# Patient Record
Sex: Male | Born: 1970 | Race: White | Hispanic: No | Marital: Married | Smoking: Former smoker
Health system: Southern US, Community
[De-identification: ages and names within clinical notes are randomized; demographics above are authoritative.]

## PROBLEM LIST (undated history)

## (undated) DIAGNOSIS — I10 Essential (primary) hypertension: Secondary | ICD-10-CM

## (undated) DIAGNOSIS — N289 Disorder of kidney and ureter, unspecified: Secondary | ICD-10-CM

## (undated) DIAGNOSIS — E119 Type 2 diabetes mellitus without complications: Secondary | ICD-10-CM

## (undated) HISTORY — PX: OTHER SURGICAL HISTORY: SHX169

---

## 2005-06-24 ENCOUNTER — Emergency Department: Payer: Self-pay

## 2005-08-20 ENCOUNTER — Emergency Department: Payer: Self-pay | Admitting: Emergency Medicine

## 2005-09-04 ENCOUNTER — Inpatient Hospital Stay: Payer: Self-pay | Admitting: Internal Medicine

## 2005-10-13 ENCOUNTER — Inpatient Hospital Stay (HOSPITAL_COMMUNITY): Admission: AD | Admit: 2005-10-13 | Discharge: 2005-10-16 | Payer: Self-pay | Admitting: Internal Medicine

## 2005-10-13 ENCOUNTER — Emergency Department: Payer: Self-pay | Admitting: Emergency Medicine

## 2011-07-28 ENCOUNTER — Emergency Department: Payer: Self-pay | Admitting: Emergency Medicine

## 2011-07-28 LAB — URINALYSIS, COMPLETE
Bacteria: NONE SEEN
Bilirubin,UR: NEGATIVE
Blood: NEGATIVE
Glucose,UR: >=500 mg/dL (ref 0–75)
Leukocyte Esterase: NEGATIVE
Nitrite: NEGATIVE
Ph: 5 (ref 4.5–8.0)
Protein: NEGATIVE
RBC,UR: NONE SEEN /HPF (ref 0–5)
Specific Gravity: 1.032 (ref 1.003–1.030)
Squamous Epithelial: NONE SEEN
WBC UR: NONE SEEN /HPF (ref 0–5)

## 2011-07-28 LAB — COMPREHENSIVE METABOLIC PANEL
Albumin: 4.3 g/dL (ref 3.4–5.0)
Alkaline Phosphatase: 101 U/L (ref 50–136)
Anion Gap: 11 (ref 7–16)
BUN: 21 mg/dL — ABNORMAL HIGH (ref 7–18)
Bilirubin,Total: 0.6 mg/dL (ref 0.2–1.0)
Calcium, Total: 9.5 mg/dL (ref 8.5–10.1)
Chloride: 95 mmol/L — ABNORMAL LOW (ref 98–107)
Co2: 26 mmol/L (ref 21–32)
Creatinine: 1.08 mg/dL (ref 0.60–1.30)
EGFR (African American): >60
EGFR (Non-African Amer.): >60
Glucose: 471 mg/dL — ABNORMAL HIGH (ref 65–99)
Osmolality: 288 (ref 275–301)
Potassium: 4 mmol/L (ref 3.5–5.1)
SGOT(AST): 13 U/L — ABNORMAL LOW (ref 15–37)
SGPT (ALT): 21 U/L
Sodium: 132 mmol/L — ABNORMAL LOW (ref 136–145)
Total Protein: 7.9 g/dL (ref 6.4–8.2)

## 2011-07-28 LAB — CBC
HCT: 47.1 % (ref 40.0–52.0)
HGB: 15.6 g/dL (ref 13.0–18.0)
MCH: 29.2 pg (ref 26.0–34.0)
MCHC: 33.2 g/dL (ref 32.0–36.0)
MCV: 88 fL (ref 80–100)
Platelet: 239 10*3/uL (ref 150–440)
RBC: 5.34 10*6/uL (ref 4.40–5.90)
RDW: 12.2 % (ref 11.5–14.5)
WBC: 12.5 10*3/uL — ABNORMAL HIGH (ref 3.8–10.6)

## 2012-11-15 ENCOUNTER — Emergency Department: Payer: Self-pay | Admitting: Emergency Medicine

## 2012-11-15 LAB — CBC
HCT: 42.7 % (ref 40.0–52.0)
HGB: 15.1 g/dL (ref 13.0–18.0)
MCH: 30 pg (ref 26.0–34.0)
MCHC: 35.3 g/dL (ref 32.0–36.0)
MCV: 85 fL (ref 80–100)
Platelet: 201 10*3/uL (ref 150–440)
RBC: 5.02 10*6/uL (ref 4.40–5.90)
RDW: 12.7 % (ref 11.5–14.5)
WBC: 15.6 10*3/uL — ABNORMAL HIGH (ref 3.8–10.6)

## 2012-11-15 LAB — COMPREHENSIVE METABOLIC PANEL
Albumin: 3.5 g/dL (ref 3.4–5.0)
Alkaline Phosphatase: 90 U/L (ref 50–136)
Anion Gap: 5 — ABNORMAL LOW (ref 7–16)
BUN: 16 mg/dL (ref 7–18)
Bilirubin,Total: 0.7 mg/dL (ref 0.2–1.0)
Calcium, Total: 8.8 mg/dL (ref 8.5–10.1)
Chloride: 106 mmol/L (ref 98–107)
Co2: 29 mmol/L (ref 21–32)
Creatinine: 0.8 mg/dL (ref 0.60–1.30)
EGFR (African American): >60
EGFR (Non-African Amer.): >60
Glucose: 177 mg/dL — ABNORMAL HIGH (ref 65–99)
Osmolality: 285 (ref 275–301)
Potassium: 3.1 mmol/L — ABNORMAL LOW (ref 3.5–5.1)
SGOT(AST): 23 U/L (ref 15–37)
SGPT (ALT): 20 U/L (ref 12–78)
Sodium: 140 mmol/L (ref 136–145)
Total Protein: 6.8 g/dL (ref 6.4–8.2)

## 2012-11-15 LAB — TROPONIN I: Troponin-I: <0.02 ng/mL

## 2012-11-15 LAB — LIPASE, BLOOD: Lipase: 58 U/L — ABNORMAL LOW (ref 73–393)

## 2013-08-04 ENCOUNTER — Encounter (HOSPITAL_COMMUNITY): Payer: Self-pay | Admitting: Emergency Medicine

## 2013-08-04 DIAGNOSIS — R55 Syncope and collapse: Secondary | ICD-10-CM | POA: Insufficient documentation

## 2013-08-04 DIAGNOSIS — E119 Type 2 diabetes mellitus without complications: Secondary | ICD-10-CM | POA: Insufficient documentation

## 2013-08-04 DIAGNOSIS — Z87891 Personal history of nicotine dependence: Secondary | ICD-10-CM | POA: Insufficient documentation

## 2013-08-04 DIAGNOSIS — Z87448 Personal history of other diseases of urinary system: Secondary | ICD-10-CM | POA: Insufficient documentation

## 2013-08-04 LAB — I-STAT CHEM 8, ED
BUN: 14 mg/dL (ref 6–23)
Calcium, Ion: 1.22 mmol/L (ref 1.12–1.23)
Chloride: 97 mEq/L (ref 96–112)
Creatinine, Ser: 0.8 mg/dL (ref 0.50–1.35)
Glucose, Bld: 419 mg/dL — ABNORMAL HIGH (ref 70–99)
HCT: 44 % (ref 39.0–52.0)
Hemoglobin: 15 g/dL (ref 13.0–17.0)
Potassium: 3.9 mEq/L (ref 3.7–5.3)
Sodium: 137 mEq/L (ref 137–147)
TCO2: 27 mmol/L (ref 0–100)

## 2013-08-04 LAB — CBC
HCT: 41.5 % (ref 39.0–52.0)
Hemoglobin: 15 g/dL (ref 13.0–17.0)
MCH: 30.2 pg (ref 26.0–34.0)
MCHC: 36.1 g/dL — ABNORMAL HIGH (ref 30.0–36.0)
MCV: 83.7 fL (ref 78.0–100.0)
Platelets: 187 10*3/uL (ref 150–400)
RBC: 4.96 MIL/uL (ref 4.22–5.81)
RDW: 12 % (ref 11.5–15.5)
WBC: 6.8 10*3/uL (ref 4.0–10.5)

## 2013-08-04 LAB — URINALYSIS, ROUTINE W REFLEX MICROSCOPIC
Bilirubin Urine: NEGATIVE
Glucose, UA: >1000 mg/dL — AB
Hgb urine dipstick: NEGATIVE
Ketones, ur: 15 mg/dL — AB
Leukocytes, UA: NEGATIVE
Nitrite: NEGATIVE
Protein, ur: NEGATIVE mg/dL
Specific Gravity, Urine: 1.01 (ref 1.005–1.030)
Urobilinogen, UA: 1 mg/dL (ref 0.0–1.0)
pH: 6 (ref 5.0–8.0)

## 2013-08-04 LAB — URINE MICROSCOPIC-ADD ON

## 2013-08-04 NOTE — ED Notes (Signed)
Pt in via EMS to triage c/o dehydration, pt called from [employer] due to hands shaking, CBG 310, states he has been working outside all day and hasn't eaten anything all day and wanted to get checked out. Pt ambulatory without distress.

## 2013-08-04 NOTE — ED Notes (Signed)
[facility] very sweaty, hot, dizzy; had no dinner today and is DM - cbg by ems 310.

## 2013-08-05 ENCOUNTER — Emergency Department (HOSPITAL_COMMUNITY)
Admission: EM | Admit: 2013-08-05 | Discharge: 2013-08-05 | Disposition: A | Discharge status: Home / Self Care | Payer: Self-pay | Attending: Emergency Medicine | Admitting: Emergency Medicine

## 2013-08-05 DIAGNOSIS — R739 Hyperglycemia, unspecified: Secondary | ICD-10-CM

## 2013-08-05 DIAGNOSIS — R55 Syncope and collapse: Secondary | ICD-10-CM

## 2013-08-05 HISTORY — DX: Disorder of kidney and ureter, unspecified: N28.9

## 2013-08-05 HISTORY — DX: Type 2 diabetes mellitus without complications: E11.9

## 2013-08-05 LAB — CBG MONITORING, ED: Glucose-Capillary: 441 mg/dL — ABNORMAL HIGH (ref 70–99)

## 2013-08-05 NOTE — ED Notes (Signed)
MD at bedside. 

## 2013-08-05 NOTE — ED Notes (Signed)
Pt reports he has not taken his insulin tonight, pt reports he was at [employer] earlier this evening and he got really sweaty and did not feel like himself, pt's wife states she thinks he had a panic attack. Pt states his CBG is usually between 200-400. Pt reports he was unsure if he should take his insulin while he was in the waiting room because he did not want to mess up his examination.

## 2013-08-05 NOTE — ED Provider Notes (Signed)
CSN: [id number]     [id number] date & time 08/04/13  1931 History   First MD Initiated Contact with Patient 08/05/13 0129     Chief Complaint  Patient presents with  . Dehydration     (Consider location/radiation/quality/duration/timing/severity/associated sxs/prior Treatment) HPI Comments: 43 year old male who is a diabetic, he was in a restaurant this evening, ordered and ice cream and as he was eating it he states that he felt sweaty, lightheaded and had near syncope. He noticed mild tremor, the symptoms completely resolved rather quickly and he is now asymptomatic. His gait was otherwise normal, no chest pain shortness of breath belly pain back pain diarrhea dysuria sore throat headache or other complaints. His blood sugar was 274 at the time of the incident, he has not had his nighttime insulin.  The history is provided by the patient and the spouse.    Past Medical History  Diagnosis Date  . Diabetes mellitus without complication   . Kidney disease    History reviewed. No pertinent past surgical history. History reviewed. No pertinent family history. History  Substance Use Topics  . Smoking status: Former Games developermoker  . Smokeless tobacco: Not on file  . Alcohol Use: No    Review of Systems  All other systems reviewed and are negative.     Allergies  Review of patient's allergies indicates no known allergies.  Home Medications   Prior to Admission medications   Not on File   BP 117/75  Pulse 59  Temp(Src) 97.9 F (36.6 C) (Oral)  Resp 16  SpO2 98% Physical Exam  Nursing note and vitals reviewed. Constitutional: He appears well-developed and well-nourished. No distress.  HENT:  Head: Normocephalic and atraumatic.  Mouth/Throat: Oropharynx is clear and moist. No oropharyngeal exudate.  Eyes: Conjunctivae and EOM are normal. Pupils are equal, round, and reactive to light. Right eye exhibits no discharge. Left eye exhibits no discharge. No scleral icterus.  Neck:  Normal range of motion. Neck supple. No JVD present. No thyromegaly present.  Cardiovascular: Normal rate, regular rhythm, normal heart sounds and intact distal pulses.  Exam reveals no gallop and no friction rub.   No murmur heard. Pulmonary/Chest: Effort normal and breath sounds normal. No respiratory distress. He has no wheezes. He has no rales.  Abdominal: Soft. Bowel sounds are normal. He exhibits no distension and no mass. There is no tenderness.  Musculoskeletal: Normal range of motion. He exhibits no edema and no tenderness.  Lymphadenopathy:    He has no cervical adenopathy.  Neurological: He is alert. Coordination normal.  Skin: Skin is warm and dry. No rash noted. No erythema.  Psychiatric: He has a normal mood and affect. His behavior is normal.    ED Course  Procedures (including critical care time) Labs Review Labs Reviewed  CBC - Abnormal; Notable for the following:    MCHC 36.1 (*)    All other components within normal limits  URINALYSIS, ROUTINE W REFLEX MICROSCOPIC - Abnormal; Notable for the following:    Glucose, UA >1000 (*)    Ketones, ur 15 (*)    All other components within normal limits  I-STAT CHEM 8, ED - Abnormal; Notable for the following:    Glucose, Bld 419 (*)    All other components within normal limits  CBG MONITORING, ED - Abnormal; Notable for the following:    Glucose-Capillary 441 (*)    All other components within normal limits  URINE MICROSCOPIC-ADD ON    Imaging Review No results found.  EKG Interpretation   Date/Time:  Friday Aug 05 2013 01:59:22 EDT Ventricular Rate:  57 PR Interval:  193 QRS Duration: 87 QT Interval:  452 QTC Calculation: 440 R Axis:   89 Text Interpretation:  Sinus bradycardia ECG OTHERWISE WITHIN NORMAL LIMITS  Since last tracing rate slower Confirmed by Sorah Falkenstein  MD, Shamyra Farias ([id number]) on  08/05/2013 2:04:25 AM      MDM   Final diagnoses:  Near syncope  Hyperglycemia    The patient has a normal exam  however his urinalysis shows glucosuria, mild ketonuria and he is hyperglycemic on laboratory workup. There is no acidosis, no increased anion gap at the patient otherwise appears stable. He has no symptoms, his EKG is normal, he is stable for discharge. The patient has been informed of his exam, his results and his indications and need for followup and has expressed his understanding.  The patient will be discharged home in stable condition, he has taken his insulin in the emergency department prior to discharge    Vida RollerBrian D Jamelyn Bovard, MD 08/05/13 0206

## 2013-08-05 NOTE — ED Notes (Signed)
Pt's family requested ice. Ice provided.

## 2013-08-09 LAB — CBG MONITORING, ED: Glucose-Capillary: 351 mg/dL — ABNORMAL HIGH (ref 70–99)

## 2013-11-05 ENCOUNTER — Inpatient Hospital Stay: Payer: Self-pay | Admitting: Internal Medicine

## 2013-11-05 LAB — CBC WITH DIFFERENTIAL/PLATELET
Basophil #: 0.1 10*3/uL (ref 0.0–0.1)
Basophil %: 0.3 %
Eosinophil #: 0 10*3/uL (ref 0.0–0.7)
Eosinophil %: 0 %
HCT: 47.7 % (ref 40.0–52.0)
HGB: 16 g/dL (ref 13.0–18.0)
Lymphocyte #: 0.9 10*3/uL — ABNORMAL LOW (ref 1.0–3.6)
Lymphocyte %: 3.5 %
MCH: 29.9 pg (ref 26.0–34.0)
MCHC: 33.5 g/dL (ref 32.0–36.0)
MCV: 90 fL (ref 80–100)
Monocyte #: 1.1 x10 3/mm — ABNORMAL HIGH (ref 0.2–1.0)
Monocyte %: 4.3 %
Neutrophil #: 23.9 10*3/uL — ABNORMAL HIGH (ref 1.4–6.5)
Neutrophil %: 91.9 %
Platelet: 270 10*3/uL (ref 150–440)
RBC: 5.33 10*6/uL (ref 4.40–5.90)
RDW: 12.4 % (ref 11.5–14.5)
WBC: 26 10*3/uL — ABNORMAL HIGH (ref 3.8–10.6)

## 2013-11-05 LAB — URINALYSIS, COMPLETE
Bacteria: NONE SEEN
Bilirubin,UR: NEGATIVE
Blood: NEGATIVE
Glucose,UR: >=500 mg/dL (ref 0–75)
Leukocyte Esterase: NEGATIVE
Nitrite: NEGATIVE
Ph: 5 (ref 4.5–8.0)
Protein: NEGATIVE
RBC,UR: NONE SEEN /HPF (ref 0–5)
Specific Gravity: 1.029 (ref 1.003–1.030)
Squamous Epithelial: NONE SEEN
WBC UR: <1 /HPF (ref 0–5)

## 2013-11-05 LAB — COMPREHENSIVE METABOLIC PANEL
Albumin: 4.5 g/dL (ref 3.4–5.0)
Alkaline Phosphatase: 88 U/L
Anion Gap: 12 (ref 7–16)
BUN: 29 mg/dL — ABNORMAL HIGH (ref 7–18)
Bilirubin,Total: 1.3 mg/dL — ABNORMAL HIGH (ref 0.2–1.0)
Calcium, Total: 9.9 mg/dL (ref 8.5–10.1)
Chloride: 99 mmol/L (ref 98–107)
Co2: 23 mmol/L (ref 21–32)
Creatinine: 1.29 mg/dL (ref 0.60–1.30)
EGFR (African American): >60
EGFR (Non-African Amer.): >60
Glucose: 478 mg/dL — ABNORMAL HIGH (ref 65–99)
Osmolality: 295 (ref 275–301)
Potassium: 4.5 mmol/L (ref 3.5–5.1)
SGOT(AST): 21 U/L (ref 15–37)
SGPT (ALT): 21 U/L
Sodium: 134 mmol/L — ABNORMAL LOW (ref 136–145)
Total Protein: 8.2 g/dL (ref 6.4–8.2)

## 2013-11-05 LAB — TROPONIN I: Troponin-I: <0.02 ng/mL

## 2013-11-05 LAB — LIPASE, BLOOD: Lipase: 46 U/L — ABNORMAL LOW (ref 73–393)

## 2013-11-05 LAB — SEDIMENTATION RATE: Erythrocyte Sed Rate: 3 mm/hr (ref 0–15)

## 2013-11-05 LAB — MAGNESIUM: Magnesium: 2.1 mg/dL

## 2013-11-06 LAB — CBC WITH DIFFERENTIAL/PLATELET
Basophil #: 0.1 10*3/uL (ref 0.0–0.1)
Basophil %: 0.6 %
Eosinophil #: 0.2 10*3/uL (ref 0.0–0.7)
Eosinophil %: 1.3 %
HCT: 38.8 % — ABNORMAL LOW (ref 40.0–52.0)
HGB: 13 g/dL (ref 13.0–18.0)
Lymphocyte #: 2.8 10*3/uL (ref 1.0–3.6)
Lymphocyte %: 21.3 %
MCH: 29.6 pg (ref 26.0–34.0)
MCHC: 33.4 g/dL (ref 32.0–36.0)
MCV: 89 fL (ref 80–100)
Monocyte #: 0.8 x10 3/mm (ref 0.2–1.0)
Monocyte %: 5.8 %
Neutrophil #: 9.3 10*3/uL — ABNORMAL HIGH (ref 1.4–6.5)
Neutrophil %: 71 %
Platelet: 190 10*3/uL (ref 150–440)
RBC: 4.38 10*6/uL — ABNORMAL LOW (ref 4.40–5.90)
RDW: 12.1 % (ref 11.5–14.5)
WBC: 13.1 10*3/uL — ABNORMAL HIGH (ref 3.8–10.6)

## 2013-11-06 LAB — BASIC METABOLIC PANEL
Anion Gap: 4 — ABNORMAL LOW (ref 7–16)
BUN: 22 mg/dL — ABNORMAL HIGH (ref 7–18)
Calcium, Total: 8.2 mg/dL — ABNORMAL LOW (ref 8.5–10.1)
Chloride: 109 mmol/L — ABNORMAL HIGH (ref 98–107)
Co2: 27 mmol/L (ref 21–32)
Creatinine: 0.9 mg/dL (ref 0.60–1.30)
EGFR (African American): >60
EGFR (Non-African Amer.): >60
Glucose: 139 mg/dL — ABNORMAL HIGH (ref 65–99)
Osmolality: 285 (ref 275–301)
Potassium: 4.9 mmol/L (ref 3.5–5.1)
Sodium: 140 mmol/L (ref 136–145)

## 2013-11-06 LAB — HEMOGLOBIN A1C: Hemoglobin A1C: 10.3 % — ABNORMAL HIGH (ref 4.2–6.3)

## 2013-11-10 LAB — CULTURE, BLOOD (SINGLE)

## 2014-07-29 NOTE — Discharge Summary (Signed)
PATIENT NAMLillia Francis:  Francis Francis Francis MR#:  [id number] DATE OF BIRTH:  Jun 07, 1970  DATE OF ADMISSION:  11/05/2013 DATE OF DISCHARGE:  11/06/2013  PRIMARY CARE PHYSICIAN: Nonlocal.  DISCHARGE DIAGNOSES:  1.  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  2.  Dehydration.  3.  Acute gastritis.   CONDITION: Stable.   CODE STATUS: Full code.   HOME MEDICATIONS: Please refer to the medication reconciliation list. Continue the patient's home medications.  DIET: ADA diet.   ACTIVITY: As tolerated.   FOLLOWUP CARE: Follow with PCP within 1-2 weeks.   REASON FOR ADMISSION: Multiple episodes of nausea and vomiting. The patient is a 44 year old Caucasian male with a history of diabetes and hypertension who presented to the ED with multiple episodes of nausea and vomiting, but no diarrhea. The patient was noted to have leukocytosis at 26,000. Blood sugar was 478.    For detailed history and physical examination, please refer to the admission note dictated by Dr. Joycelyn RuaMichael Francis.   LABORATORY DATA ON ADMISSION: Showed BUN 29, creatinine 1.29, glucose 478, sodium 134, WBCs 26,000; otherwise unremarkable.   BRIEF HOSPITAL COURSE:  1.  Hyperosmotic non-ketotic state with type 2 diabetes. After admission, the patient has been treated with IV fluid support with Levemir and sliding scale NovoLog 12 units subcutaneously t.i.d. before meals. The patient's blood sugar has been improving. The last blood sugar was 192.  2.  Dehydration. After IV fluid support BUN decreased to 22.  3.  Acute gastritis with reactive leukocytosis. The patient's nausea and vomiting have improved after admission. WBC decreased to 13.1.   The patient has no complaints. His vital signs are stable. He is clinically stable and will be discharged to home today.   I discussed the patient's discharge plan with the patient, the patient's wife, and his nurse.   TIME SPENT: About 36 minutes.    ____________________________ Antonio PollackQing  Chieko Neises, MD qc:lt D: 11/06/2013 11:30:56 ET T: 11/06/2013 12:40:05 ET JOB#: [id number]  cc: Antonio PollackQing Antonio Herne, MD, <Dictator> Antonio PollackQING Jann Milkovich MD ELECTRONICALLY SIGNED 11/07/2013 11:46

## 2014-07-29 NOTE — H&P (Signed)
PATIENT NAMEDILAN, [name] MR#:  [id number] DATE OF BIRTH:  1970/12/13  DATE OF ADMISSION:  11/05/2013  REFERRING PHYSICIAN: Dorothea Glassman, MD  PRIMARY CARE PHYSICIAN: Nonlocal.  ADMIT DIAGNOSES:  1. Hyperosmotic nonketotic state in a patient with diabetes type 2. 2. Sepsis.  HISTORY OF PRESENT ILLNESS: This is a 44 year old Caucasian male who presented to the Emergency Department with multiple episodes of nausea and vomiting. The patient states he never saw any blood in his vomit but that it was consistently green in color. He denies any significant abdominal pain aside from when he is retching. He states that he has diabetes, and he admits to chills. All of his symptoms began today, approximately 6 hours prior to arrival in the Emergency Department. Prior to that, he was in his usual state of health. Laboratory evaluation revealed leukocytosis of 26,000 as well as a blood glucose of 478, which prompted the Emergency Department staff to contact hospitalist service for admission.   REVIEW OF SYSTEMS: CONSTITUTIONAL: The patient denies actual fever, but admits to chills and feeling of generalized fatigue.  EYES: Denies decrease in visual acuity or inflammation of sclerae.  ENT: The patient denies nosebleeds or difficulty swallowing or pain with swallowing. RESPIRATORY: The patient denies cough or wheezing and shortness of breath.  CARDIOVASCULAR: The patient denies chest pain or palpitations.  GASTROINTESTINAL: The patient admits to nausea and vomiting. Denies abdominal pain or diarrhea.  GENITOURINARY: The patient denies dysuria or hematuria. ENDOCRINOLOGY: The patient denies temperature intolerance, but admits to polyuria.  HEMATOLOGIC AND LYMPHATIC: The patient denies easy bruising or bleeding.  INTEGUMENTARY: The patient denies rash, but admits to multiple inspect bites on both his arms and legs.  MUSCULOSKELETAL: The patient denies arthralgias or myalgias.  NEUROLOGIC: The patient denies  weakness or paresthesias. PSYCHIATRIC: The patient denies suicidal ideation or homicidal ideation.   PAST MEDICAL HISTORY: Significant for diabetes type 2 as well as hypertension.  PAST SURGICAL HISTORY: None.   FAMILY HISTORY: Diabetes type 2 in his sister as well as cervical cancer and diabetes in his mother.  SOCIAL HISTORY: The patient does not smoke, drink or do any drugs.   MEDICATIONS: Include: 1. Lisinopril 2.5 mg 1 tab p.o. daily. 2. Lantus 34 units subcutaneous at bedtime. 3. NovoLog 12 units 3 times daily before meals. 4. Pantoprazole 20 mg 1 tab p.o. daily.  ALLERGIES: No known drug allergies.   PERTINENT LABORATORY DATA AND RADIOGRAPHIC FINDINGS: White blood cell count of 26,000. Normal anion gap of 12. Sodium 134. KUB shows no acute cardiopulmonary process. There is a mild amount of stool in the large bowel. CT abdomen is pending at the time of dictation. Venous blood gas results: The pH 7.3, pCO2 42, bicarb 20.7, bicarb excess -5.5; on room air.   PHYSICAL EXAMINATION: VITAL SIGNS: Temperature 97.8, heart rate 98, respiratory rate 18, blood pressure 124/69, pulse oximetry 100% on room air. GENERAL: The patient is alert and oriented x3. He is in no apparent distress, although he can be seen with some occasional rigors.  HEENT: Normocephalic, atraumatic. EOMI. PERRLA. Mild tacky mucous membranes. Poor dentition. NECK: Trachea is midline. No adenopathy. CHEST: Symmetric and atraumatic. CARDIOVASCULAR: Regular rate and rhythm. Normal S1, S2. No rubs, clicks or murmurs. LUNGS: Clear to auscultation bilaterally. Normal effort and excursion. ABDOMEN: Positive bowel sounds. Soft, nontender, nondistended. No hepatosplenomegaly.  EXTREMITIES: No clubbing, cyanosis or edema. SKIN: No rashes, but there are multiple scratch and pick lesions consistent with excoriated insect wounds. He has no  fluctuance to any of these wounds, but some have a slightly greenish crust.   MUSCULOSKELETAL: The patient moves all 4 extremities equally. GENITOURINARY: Deferred. NEUROLOGIC: Cranial nerves II through XII grossly intact. PSYCHIATRIC: Mood is normal, affect is congruent.  ASSESSMENT AND PLAN:  1. This is a 44 year old with hyperosmolar nonketotic state. The patient has no anion gap, and he is nonketotic. We will start the patient on aggressive IV hydration. He will eat a carbohydrate consistent diet, and we will dose insulin based on sliding scale.  2. Sepsis. No definite etiology at this time; however, it is likely that many of the patient's insect bites may be infected and may have contributed to systemic infection. Urine cultures have been obtained. Blood cultures were actually obtained after antibiotics began. Zosyn started in the ED. I have added vancomycin to his treatment.  3. Nausea and vomiting. The bilious nature of the vomiting is somewhat concerning; however, the patient has a benign abdominal exam. Zofran p.r.n. nausea and vomiting, which I expect to resolve as his hyperglycemia improves and he has antibiotics in his system.  4. Gastrointestinal prophylaxis. Pantoprazole. 8. Deep venous thrombosis prophylaxis. SCDs.  CODE STATUS: The patient is a full code.   TIME SPENT ON ADMISSION ORDERS AND PATIENT CARE: 35 MINUTES  ____________________________ Kelton PillarMichael S. Sheryle Hailiamond, MD msd:lb D: 11/05/2013 07:37:35 ET T: 11/05/2013 07:52:19 ET JOB#: [id number]  cc: Kelton PillarMichael S. Sheryle Hailiamond, MD, <Dictator> Kelton PillarMICHAEL S Trevaughn Schear MD ELECTRONICALLY SIGNED 11/06/2013 1:37

## 2016-12-19 DIAGNOSIS — E860 Dehydration: Secondary | ICD-10-CM

## 2016-12-19 DIAGNOSIS — E1165 Type 2 diabetes mellitus with hyperglycemia: Secondary | ICD-10-CM

## 2016-12-19 DIAGNOSIS — E871 Hypo-osmolality and hyponatremia: Secondary | ICD-10-CM

## 2016-12-19 DIAGNOSIS — E1310 Other specified diabetes mellitus with ketoacidosis without coma: Secondary | ICD-10-CM

## 2016-12-19 DIAGNOSIS — R112 Nausea with vomiting, unspecified: Secondary | ICD-10-CM

## 2019-05-09 DIAGNOSIS — L03116 Cellulitis of left lower limb: Secondary | ICD-10-CM

## 2019-05-09 DIAGNOSIS — E11628 Type 2 diabetes mellitus with other skin complications: Secondary | ICD-10-CM

## 2019-05-09 DIAGNOSIS — E860 Dehydration: Secondary | ICD-10-CM

## 2019-05-09 DIAGNOSIS — S91332A Puncture wound without foreign body, left foot, initial encounter: Secondary | ICD-10-CM

## 2019-05-09 DIAGNOSIS — E871 Hypo-osmolality and hyponatremia: Secondary | ICD-10-CM

## 2019-05-11 DIAGNOSIS — L03116 Cellulitis of left lower limb: Secondary | ICD-10-CM

## 2019-05-11 DIAGNOSIS — S92332A Displaced fracture of third metatarsal bone, left foot, initial encounter for closed fracture: Secondary | ICD-10-CM

## 2019-05-11 DIAGNOSIS — E11628 Type 2 diabetes mellitus with other skin complications: Secondary | ICD-10-CM

## 2019-05-12 DIAGNOSIS — L03116 Cellulitis of left lower limb: Secondary | ICD-10-CM

## 2019-05-12 DIAGNOSIS — E11628 Type 2 diabetes mellitus with other skin complications: Secondary | ICD-10-CM

## 2019-05-12 DIAGNOSIS — S91332A Puncture wound without foreign body, left foot, initial encounter: Secondary | ICD-10-CM

## 2019-06-21 ENCOUNTER — Ambulatory Visit (INDEPENDENT_AMBULATORY_CARE_PROVIDER_SITE_OTHER): Payer: Medicaid Other | Admitting: Internal Medicine

## 2019-06-21 ENCOUNTER — Telehealth: Payer: Self-pay | Admitting: Internal Medicine

## 2019-06-21 ENCOUNTER — Other Ambulatory Visit: Payer: Self-pay

## 2019-06-21 VITALS — BP 132/80 | HR 84 | Temp 98.1°F | Wt 170.4 lb

## 2019-06-21 DIAGNOSIS — E785 Hyperlipidemia, unspecified: Secondary | ICD-10-CM

## 2019-06-21 DIAGNOSIS — R739 Hyperglycemia, unspecified: Secondary | ICD-10-CM

## 2019-06-21 DIAGNOSIS — Z794 Long term (current) use of insulin: Secondary | ICD-10-CM

## 2019-06-21 DIAGNOSIS — E119 Type 2 diabetes mellitus without complications: Secondary | ICD-10-CM

## 2019-06-21 LAB — POCT GLYCOSYLATED HEMOGLOBIN (HGB A1C): Hemoglobin A1C: 9.2 % — AB (ref 4.0–5.6)

## 2019-06-21 NOTE — Patient Instructions (Addendum)
-   Decrease Levemir 26 units daily  - Novolog 8 units with each meal  - Novolog correctional insulin: ADD extra units on insulin to your meal-time Novolog dose if your blood sugars are higher than 160. Use the scale below to help guide you:   Blood sugar before meal Number of units to inject  Less than 160 0 unit   161-  190 1 units  191 -  220 2 units  221 -  250 3 units  251 -  280 4 units  281 -  310 5 units  311-  340 6 units  341-  370 7 units  371 -  400 8 units  401-430 9 units    Choose healthy, lower carb lower calorie snacks: toss salad, cooked vegetables, cottage cheese, peanut butter, low fat cheese / string cheese, lower sodium deli meat, tuna salad or chicken salad     HOW TO TREAT LOW BLOOD SUGARS (Blood sugar LESS THAN 70 MG/DL)  Please follow the RULE OF 15 for the treatment of hypoglycemia treatment (when your (blood sugars are less than 70 mg/dL)    STEP 1: Take 15 grams of carbohydrates when your blood sugar is low, which includes:   3-4 GLUCOSE TABS  OR  3-4 OZ OF JUICE OR REGULAR SODA OR  ONE TUBE OF GLUCOSE GEL     STEP 2: RECHECK blood sugar in 15 MINUTES STEP 3: If your blood sugar is still low at the 15 minute recheck --> then, go back to STEP 1 and treat AGAIN with another 15 grams of carbohydrates.

## 2019-06-21 NOTE — Telephone Encounter (Signed)
LMTCB to discuss pt concern

## 2019-06-21 NOTE — Progress Notes (Signed)
Name: [name]  MRN/ DOB: [id number], 11-22-1970   Age/ Sex: 49 y.o., male    PCP: [place], Sarah T, MD   Reason for Endocrinology Evaluation: Type 2 Diabetes Mellitus     Date of Initial Endocrinology Visit: 06/21/2019     PATIENT IDENTIFIER: [name] is a 49 y.o. male with a past medical history of DM. The patient presented for initial endocrinology clinic visit on 06/21/2019 for consultative assistance with his diabetes management.    HPI: [name] is accompanied by his wife [name]    Diagnosed with DM at age 38  Prior Medications tried/Intolerance: he was initially on Glimepiride and metformin with persistent hyperglycemia, he was subsequently switched to insulin years later.  Currently checking blood sugars 3 x / day Hypoglycemia episodes : yes           Symptoms: feels funny, shaky              Frequency: /  Hemoglobin A1c has ranged from 8.6%  in 2020, peaking at 10.3% in 2.05 Patient required assistance for hypoglycemia: no  Patient has required hospitalization within the last 1 year from hyper or hypoglycemia: yes  In terms of diet, the patient eats 3 meals, snacks 3 x a day, drinks propel .   Pt is interested in a pump at some point   HOME DIABETES REGIMEN: Levemir 32 units daily  Novolog SS   70-130 = 2 units 131-180 = 4 units 181- 240 = 8 241- 300= 10 301- 350= 12  351- 400= 16    Statin:No ACE-I/ARB:No Prior Diabetic Education: Yes   METER DOWNLOAD SUMMARY: Date range evaluated:   68-475 mg/dL     DIABETIC COMPLICATIONS: Microvascular complications:    Denies: neuropathy, CKD , retinopathy   Last eye exam: Completed does not recall  Macrovascular complications:    Denies: CAD, PVD, CVA   PAST HISTORY: Past Medical History:  Past Medical History:  Diagnosis Date  . Diabetes mellitus without complication   . Kidney disease     Past Surgical History: No past surgical history on file.   Social History:  reports that he  has quit smoking. He does not have any smokeless tobacco history on file. He reports that he does not drink alcohol or use drugs.  Family History: No family history on file.   HOME MEDICATIONS: Allergies as of 06/21/2019      Reactions   Piperacillin Hives   Tazobactam Hives      Medication List       Accurate as of June 21, 2019  3:02 PM. If you have any questions, ask your nurse or doctor.        insulin aspart 100 UNIT/ML injection Commonly known as: novoLOG Inject 12 Units into the skin. 2 times daily   insulin detemir 100 UNIT/ML FlexPen Commonly known as: LEVEMIR Inject into the skin. 32 units nightly   oxyCODONE 5 MG immediate release tablet Commonly known as: Oxy IR/ROXICODONE Take 5 mg by mouth.        ALLERGIES: Allergies  Allergen Reactions  . Piperacillin Hives  . Tazobactam Hives     REVIEW OF SYSTEMS: A comprehensive ROS was conducted with the patient and is negative except as per HPI and below:  ROS    OBJECTIVE:   VITAL SIGNS: BP 132/80 (BP Location: Left Arm, Patient Position: Sitting, Cuff Size: Large)   Pulse 84   Temp 98.1 F (36.7 C)   Wt 170 lb  6.4 oz (77.3 kg)   SpO2 98%    PHYSICAL EXAM:  General: Pt appears well and is in NAD  HEENT:  Eyes: External eye exam normal without stare, lid lag or exophthalmos.  EOM intact  Neck: General: Supple without adenopathy or carotid bruits. Thyroid: Thyroid size normal.  No goiter or nodules appreciated. No thyroid bruit.  Lungs: Clear with good BS bilat with no rales, rhonchi, or wheezes  Heart: RRR with normal S1 and S2 and no gallops; no murmurs; no rub  Abdomen: Normoactive bowel sounds, soft, nontender, without masses or organomegaly palpable  Extremities:  Lower extremities - No pretibial edema. No lesions.  Skin: Normal texture and temperature to palpation.   Neuro: MS is good with appropriate affect, pt is alert and Ox3    DM foot exam: deferred   DATA  REVIEWED: 02/2019 A1c 8.9%     03/16/2019  Gluc 331 BUN/CR 17/0.84 GFR 102 TG 107 LDL 91  ASSESSMENT / PLAN / RECOMMENDATIONS:   1) Insulin- Dependent Diabetes Mellitus, Poorly controlled, Without complications - Most recent A1c of 9.2 %. Goal A1c < 7.0 %.    Plan: GENERAL: I have discussed with the patient the pathophysiology of diabetes. We went over the natural progression of the disease. We talked about both insulin resistance and insulin deficiency. We stressed the importance of lifestyle changes including diet and exercise. I explained the complications associated with diabetes including retinopathy, nephropathy, neuropathy as well as increased risk of cardiovascular disease. We went over the benefit seen with glycemic control.    I explained to the patient that diabetic patients are at higher than normal risk for amputations. Discussed pharmacokinetics of basal/bolus insulin and the importance of taking prandial insulin with meals.   We also discussed avoiding sugar-sweetened beverages and snacks, when possible.   We briefly discussed CGM and pumps, he will reach out to his insurance carrier to inquire about coverage criteria.    MEDICATIONS: - Decrease Levemir 26 units daily  - Novolog 8 units with each meal  - CF ( BG-130/30)  EDUCATION / INSTRUCTIONS:  BG monitoring instructions: Patient is instructed to check his blood sugars 4 times a day, before meals and bedtime.  Call [facility] if: BG persistently < 70 or > 300. . I reviewed the Rule of 15 for the treatment of hypoglycemia in detail with the patient. Literature supplied.   2) Diabetic complications:   Eye: Does not have known diabetic retinopathy. I have urged him to have an eye exam   Neuro/ Feet: Does not have known diabetic peripheral neuropathy.  Renal: Patient does not have known baseline CKD. He is not on an ACEI/ARB at present.Will check urine albumin/creatinine ratio today  .   3) Lipids: Patient is not on a statin, we discussed cardiovascular benefits of statin , we also discussed ADA recommendations, pt in agreement to start statins at this time but will check labs today.    F/U in 3 months    Addendum: pt left without labs, will postpone to next visit, if unable to come back sooner for this   Signed electronically by: Mack Guise, MD  [facility] End[facility]ology  [address] [place], [place] [address] Phone: [phone] FAX: [phone]   CC: [place], Sarah T, [place] [address] Phone: [phone]  Fax: [phone]    Return to Endocrinology clinic as below: Future Appointments  Date Time Provider [place]  07/13/2019  8:30 AM [facility] None  09/09/2019  8:30 AM [place] Mielke, Konrad Dolores, MD [facility] None

## 2019-06-22 ENCOUNTER — Encounter: Payer: Self-pay | Admitting: Internal Medicine

## 2019-06-27 NOTE — Telephone Encounter (Signed)
Still unable to connect with pt

## 2019-07-13 ENCOUNTER — Other Ambulatory Visit: Payer: Medicaid Other

## 2019-08-02 ENCOUNTER — Other Ambulatory Visit: Payer: Medicaid Other

## 2019-09-09 ENCOUNTER — Ambulatory Visit: Payer: Medicaid Other | Admitting: Internal Medicine

## 2019-10-19 DIAGNOSIS — L03116 Cellulitis of left lower limb: Secondary | ICD-10-CM | POA: Insufficient documentation

## 2019-10-19 DIAGNOSIS — E11621 Type 2 diabetes mellitus with foot ulcer: Secondary | ICD-10-CM | POA: Insufficient documentation

## 2019-10-19 DIAGNOSIS — L97509 Non-pressure chronic ulcer of other part of unspecified foot with unspecified severity: Secondary | ICD-10-CM | POA: Insufficient documentation

## 2019-10-19 DIAGNOSIS — E1110 Type 2 diabetes mellitus with ketoacidosis without coma: Secondary | ICD-10-CM | POA: Insufficient documentation

## 2019-10-19 DIAGNOSIS — E1165 Type 2 diabetes mellitus with hyperglycemia: Secondary | ICD-10-CM | POA: Insufficient documentation

## 2019-10-19 DIAGNOSIS — IMO0002 Reserved for concepts with insufficient information to code with codable children: Secondary | ICD-10-CM | POA: Insufficient documentation

## 2019-10-19 DIAGNOSIS — S91332A Puncture wound without foreign body, left foot, initial encounter: Secondary | ICD-10-CM | POA: Insufficient documentation

## 2019-10-19 DIAGNOSIS — R112 Nausea with vomiting, unspecified: Secondary | ICD-10-CM | POA: Insufficient documentation

## 2019-10-19 DIAGNOSIS — E860 Dehydration: Secondary | ICD-10-CM | POA: Insufficient documentation

## 2019-10-19 DIAGNOSIS — E871 Hypo-osmolality and hyponatremia: Secondary | ICD-10-CM | POA: Insufficient documentation

## 2019-10-20 ENCOUNTER — Other Ambulatory Visit: Payer: Self-pay

## 2019-10-20 ENCOUNTER — Ambulatory Visit (INDEPENDENT_AMBULATORY_CARE_PROVIDER_SITE_OTHER): Payer: Self-pay | Admitting: Sports Medicine

## 2019-10-20 DIAGNOSIS — M79672 Pain in left foot: Secondary | ICD-10-CM

## 2019-10-20 DIAGNOSIS — L03116 Cellulitis of left lower limb: Secondary | ICD-10-CM

## 2019-10-20 DIAGNOSIS — E0810 Diabetes mellitus due to underlying condition with ketoacidosis without coma: Secondary | ICD-10-CM

## 2019-10-20 DIAGNOSIS — L97423 Non-pressure chronic ulcer of left heel and midfoot with necrosis of muscle: Secondary | ICD-10-CM

## 2019-10-20 MED ORDER — SULFAMETHOXAZOLE-TRIMETHOPRIM 800-160 MG PO TABS: 1.0000 | ORAL_TABLET | Freq: Two times a day (BID) | ORAL | 0 refills | Status: DC

## 2019-10-20 NOTE — Progress Notes (Signed)
Subjective: [name] is a 49 y.o. male patient seen in office for evaluation of ulceration of the left heel for over 6 months. Patient has a history of diabetes and a blood glucose level not recorded but ranges from 80 to 200. Patient is changing the dressing using betadine. Reports that he went to ER last week and they took xrays and gave him antibiotics. Denies nausea/fever/vomiting/chills/night sweats/shortness of breath/pain. Patient has no other pedal complaints at this time.  Reports wound started as a small puncture wound that didn't heal and reports that when he was younger he had a injury to the heel.  Review of Systems  All other systems reviewed and are negative.  Patient Active Problem List   Diagnosis Date Noted  . Cellulitis of left foot 10/19/2019  . DKA (diabetic ketoacidoses) (HCC) 10/19/2019  . Hyponatremia 10/19/2019  . 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 Nausea & vomiting 10/19/2019  . Puncture wound of left foot 10/19/2019  . Ulcer of foot due to diabetes (HCC) 10/19/2019  . Uncontrolled type 2 diabetes mellitus (HCC) 10/19/2019  . Dyslipidemia 06/21/2019  . Insulin-requiring or dependent type II diabetes mellitus (HCC) 06/21/2019   Current Outpatient Medications on File Prior to Visit  Medication Sig Dispense Refill  . calcium carbonate (OSCAL) 1500 (600 Ca) MG TABS tablet Take by mouth 2 (two) times daily with a meal.    . Magnesium 250 MG TABS Take by mouth.    . insulin aspart (NOVOLOG) 100 UNIT/ML injection Inject 12 Units into the skin. 2 times daily    . insulin detemir (LEVEMIR) 100 UNIT/ML FlexPen Inject into the skin. 32 units nightly     No current facility-administered medications on file prior to visit.   Allergies  Allergen Reactions  . Piperacillin Hives  . Tazobactam Hives    No results found for this or any previous visit (from the past 2160 hour(s)).  Objective: There were no vitals filed for this visit.  General: Patient is awake,  alert, oriented x 3 and in no acute distress.  Dermatology: Skin is warm and dry bilateral with a full thickness ulceration present  Left central heel, Ulceration measures 6x5x3cm.There is a macerated and keratotic border with a granular base with no fatty tissue/muscle, granulation over the bone is present. There is minimal malodor, clear active drainage, faint erythema, mild edema.   Vascular: Dorsalis Pedis pulse = 2/4 Bilateral,  Posterior Tibial pulse = 1/4 Bilateral,  Capillary Fill Time < 5 seconds  Neurologic: Protective sensation diminished bilateral.  Musculosketal: There is no pain with palpation to ulcerated area. No pain with compression to calves bilateral.   No results for input(s): GRAMSTAIN, LABORGA in the last 8760 hours.  Assessment and Plan:  Problem List Items Addressed This Visit      Endocrine   DKA (diabetic ketoacidoses) (HCC)     Musculoskeletal and Integument   Cellulitis of left foot    Other Visit Diagnoses    Heel ulcer, left, with necrosis of muscle (HCC)    -  Primary   Left foot pain         -Examined patient and discussed the progression of the wound and treatment alternatives. -Xrays reviewed and Cultures reviewed from [facility] revealing staph and strept  - Excisionally dedbrided ulceration at Left plantar heel to healthy bleeding borders removing nonviable tissue using a sterile chisel blade. Wound measures post debridement as above. Wound was debrided to the level of the dermis with viable wound base exposed to  promote healing. Hemostasis was achieved with manuel pressure. Patient tolerated procedure well without any discomfort or anesthesia necessary for this wound debridement.  -Applied  and dry sterile dressing and instructed patient to continue with daily dressings at home consisting of Betadine gauze packing and dry sterile dressing. -Refill Bactrim -Requested wound VAC.  Patient desires to have his wife to learn how to reapply it. - Advised  patient to go to the ER or return to office if the wound worsens or if constitutional symptoms are present. -Patient to return to office in 1 week for follow up care and evaluation or sooner if problems arise.  Asencion Islam, DPM

## 2019-10-24 ENCOUNTER — Telehealth: Payer: Self-pay | Admitting: *Deleted

## 2019-10-24 DIAGNOSIS — L03116 Cellulitis of left lower limb: Secondary | ICD-10-CM

## 2019-10-24 DIAGNOSIS — L97423 Non-pressure chronic ulcer of left heel and midfoot with necrosis of muscle: Secondary | ICD-10-CM

## 2019-10-24 DIAGNOSIS — M79672 Pain in left foot: Secondary | ICD-10-CM

## 2019-10-24 DIAGNOSIS — E0810 Diabetes mellitus due to underlying condition with ketoacidosis without coma: Secondary | ICD-10-CM

## 2019-10-24 NOTE — Telephone Encounter (Signed)
-----   Message from Asencion Islam, [place] sent at 10/20/2019 10:55 PM EDT ----- Regarding: Wound VAC Shanesia Contact her to order wound VAC for patient.  Patient wants to have his wife learn how to apply and help with the dressing changes; rep to educate

## 2019-10-24 NOTE — Telephone Encounter (Signed)
Faxed required form, clinicals and demographics to [facility] - S. Earlene Plater, and emailed [email or web address].

## 2019-11-03 ENCOUNTER — Encounter: Payer: Self-pay | Admitting: Sports Medicine

## 2019-11-03 ENCOUNTER — Other Ambulatory Visit: Payer: Self-pay

## 2019-11-03 ENCOUNTER — Ambulatory Visit (INDEPENDENT_AMBULATORY_CARE_PROVIDER_SITE_OTHER): Payer: Self-pay | Admitting: Sports Medicine

## 2019-11-03 DIAGNOSIS — L97423 Non-pressure chronic ulcer of left heel and midfoot with necrosis of muscle: Secondary | ICD-10-CM

## 2019-11-03 DIAGNOSIS — L03116 Cellulitis of left lower limb: Secondary | ICD-10-CM

## 2019-11-03 DIAGNOSIS — E0810 Diabetes mellitus due to underlying condition with ketoacidosis without coma: Secondary | ICD-10-CM

## 2019-11-03 DIAGNOSIS — M79672 Pain in left foot: Secondary | ICD-10-CM

## 2019-11-03 NOTE — Progress Notes (Signed)
Subjective: [name] is a 49 y.o. male patient seen in office for follow up evaluation of ulceration of the left heel. Reports that the heel wound is getting smaller. Using packing but ran out of betadine. Patient got wound vac and has wife with him who is here to learn how to do the vac. Denies nausea/fever/vomiting/chills/night sweats/shortness of breath/pain. Patient has no other pedal complaints at this time.   Patient Active Problem List   Diagnosis Date Noted  . Cellulitis of left foot 10/19/2019  . DKA (diabetic ketoacidoses) (HCC) 10/19/2019  . Hyponatremia 10/19/2019  . 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 Nausea & vomiting 10/19/2019  . Puncture wound of left foot 10/19/2019  . Ulcer of foot due to diabetes (HCC) 10/19/2019  . Uncontrolled type 2 diabetes mellitus (HCC) 10/19/2019  . Dyslipidemia 06/21/2019  . Insulin-requiring or dependent type II diabetes mellitus (HCC) 06/21/2019   Current Outpatient Medications on File Prior to Visit  Medication Sig Dispense Refill  . calcium carbonate (OSCAL) 1500 (600 Ca) MG TABS tablet Take by mouth 2 (two) times daily with a meal.    . insulin aspart (NOVOLOG) 100 UNIT/ML injection Inject 12 Units into the skin. 2 times daily    . insulin detemir (LEVEMIR) 100 UNIT/ML FlexPen Inject into the skin. 32 units nightly    . Magnesium 250 MG TABS Take by mouth.    . sulfamethoxazole-trimethoprim (BACTRIM DS) 800-160 MG tablet Take 1 tablet by mouth 2 (two) times daily. 28 tablet 0   No current facility-administered medications on file prior to visit.   Allergies  Allergen Reactions  . Piperacillin Hives  . Tazobactam Hives    No results found for this or any previous visit (from the past 2160 hour(s)).  Objective: There were no vitals filed for this visit.  General: Patient is awake, alert, oriented x 3 and in no acute distress.  Dermatology: Skin is warm and dry bilateral with a full thickness ulceration present  Left central  heel, Ulceration measures 5x4x1cm.There is a minimally macerated and keratotic border with a granular base with no fatty tissue/muscle, granulation over the bone is present and does not probe as deep. There is minimal malodor, clear active drainage, resolved erythema, mild edema.   Vascular: Dorsalis Pedis pulse = 2/4 Bilateral,  Posterior Tibial pulse = 1/4 Bilateral,  Capillary Fill Time < 5 seconds  Neurologic: Protective sensation diminished bilateral.  Musculosketal: There is no pain with palpation to ulcerated area. No pain with compression to calves bilateral.   No results for input(s): GRAMSTAIN, LABORGA in the last 8760 hours.  Assessment and Plan:  Problem List Items Addressed This Visit      Endocrine   DKA (diabetic ketoacidoses) (HCC)     Musculoskeletal and Integument   Cellulitis of left foot    Other Visit Diagnoses    Heel ulcer, left, with necrosis of muscle (HCC)    -  Primary   Left foot pain         -Examined patient and discussed the progression of the wound and treatment alternatives. - Excisionally dedbrided ulceration at Left plantar heel to healthy bleeding borders removing nonviable tissue using a sterile chisel blade. Wound measures post debridement as above. Wound was debrided to the level of the dermis with viable wound base exposed to promote healing. Hemostasis was achieved with manuel pressure. Patient tolerated procedure well without any discomfort or anesthesia necessary for this wound debridement.  -Applied wound VAC at 125 mmHg with seal intact  with rep present who educated wife on how to properly change - Advised patient to go to the ER or return to office if the wound worsens or if constitutional symptoms are present. -Patient to return to office in 1 week for follow up care and evaluation or sooner if problems arise.  Asencion Islam, DPM

## 2019-11-08 ENCOUNTER — Ambulatory Visit: Payer: Medicaid Other | Admitting: Sports Medicine

## 2019-11-15 ENCOUNTER — Ambulatory Visit: Payer: Medicaid Other | Admitting: Sports Medicine

## 2019-11-22 ENCOUNTER — Encounter: Payer: Self-pay | Admitting: Sports Medicine

## 2019-11-22 ENCOUNTER — Other Ambulatory Visit: Payer: Self-pay

## 2019-11-22 ENCOUNTER — Ambulatory Visit (INDEPENDENT_AMBULATORY_CARE_PROVIDER_SITE_OTHER): Payer: Self-pay | Admitting: Sports Medicine

## 2019-11-22 DIAGNOSIS — M79672 Pain in left foot: Secondary | ICD-10-CM

## 2019-11-22 DIAGNOSIS — L97423 Non-pressure chronic ulcer of left heel and midfoot with necrosis of muscle: Secondary | ICD-10-CM

## 2019-11-22 DIAGNOSIS — L03116 Cellulitis of left lower limb: Secondary | ICD-10-CM

## 2019-11-22 DIAGNOSIS — E0810 Diabetes mellitus due to underlying condition with ketoacidosis without coma: Secondary | ICD-10-CM

## 2019-11-22 NOTE — Addendum Note (Signed)
Addended by: Dinah Beers on: 11/22/2019 02:37 PM   Modules accepted: Orders

## 2019-11-22 NOTE — Progress Notes (Signed)
Subjective: [name] is a 49 y.o. male patient seen in office for follow up evaluation of ulceration of the left heel. Reports that the heel wound is doing better without the wound VAC reports that him and his wife cannot do the dressing changes too much to handle right now at this time so wants to return it.patient denies sweats/shortness of breath/pain. Patient has no other pedal complaints at this time.   Patient Active Problem List   Diagnosis Date Noted  . Cellulitis of left foot 10/19/2019  . DKA (diabetic ketoacidoses) (HCC) 10/19/2019  . Hyponatremia 10/19/2019  . 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 Nausea & vomiting 10/19/2019  . Puncture wound of left foot 10/19/2019  . Ulcer of foot due to diabetes (HCC) 10/19/2019  . Uncontrolled type 2 diabetes mellitus (HCC) 10/19/2019  . Dyslipidemia 06/21/2019  . Insulin-requiring or dependent type II diabetes mellitus (HCC) 06/21/2019   Current Outpatient Medications on File Prior to Visit  Medication Sig Dispense Refill  . calcium carbonate (OSCAL) 1500 (600 Ca) MG TABS tablet Take by mouth 2 (two) times daily with a meal.    . insulin aspart (NOVOLOG) 100 UNIT/ML injection Inject 12 Units into the skin. 2 times daily    . insulin detemir (LEVEMIR) 100 UNIT/ML FlexPen Inject into the skin. 32 units nightly    . Magnesium 250 MG TABS Take by mouth.    . sulfamethoxazole-trimethoprim (BACTRIM DS) 800-160 MG tablet Take 1 tablet by mouth 2 (two) times daily. 28 tablet 0   No current facility-administered medications on file prior to visit.   Allergies  Allergen Reactions  . Piperacillin Hives  . Tazobactam Hives    No results found for this or any previous visit (from the past 2160 hour(s)).  Objective: There were no vitals filed for this visit.  General: Patient is awake, alert, oriented x 3 and in no acute distress.  Dermatology: Skin is warm and dry bilateral with a full thickness ulceration present  Left central heel,  Ulceration measures 5x4.2x1cm.There is a increased macerated and keratotic border with a granular base with no fatty tissue/muscle, granulation over the bone is present and does not probe as deep. There is mild malodor, clear active drainage, resolved erythema, mild edema.   Vascular: Dorsalis Pedis pulse = 2/4 Bilateral,  Posterior Tibial pulse = 1/4 Bilateral,  Capillary Fill Time < 5 seconds  Neurologic: Protective sensation diminished bilateral.  Musculosketal: There is no pain with palpation to ulcerated area. No pain with compression to calves bilateral.   No results for input(s): GRAMSTAIN, LABORGA in the last 8760 hours.  Assessment and Plan:  Problem List Items Addressed This Visit      Endocrine   DKA (diabetic ketoacidoses) (HCC)     Musculoskeletal and Integument   Cellulitis of left foot    Other Visit Diagnoses    Heel ulcer, left, with necrosis of muscle (HCC)    -  Primary   Left foot pain         -Examined patient and discussed the progression of the wound and treatment alternatives. - Excisionally dedbrided ulceration at Left plantar heel to healthy bleeding borders removing nonviable tissue using a sterile chisel blade. Wound measures post debridement as above. Wound was debrided to the level of the dermis with viable wound base exposed to promote healing. Hemostasis was achieved with manuel pressure. Patient tolerated procedure well without any discomfort or anesthesia necessary for this wound debridement.  -Wound culture obtained due to maceration and  malodor will call patient if he needs to start an oral antibiotic -Applied Betadine to the periwound area and gauze packing plantarly and advised patient to change dressings daily consisting of the same with assistance from wife -[employer] rep contacted to arrange pickup/return of wound VAC - Advised patient to go to the ER or return to office if the wound worsens or if constitutional symptoms are present. -Patient to  return to office in 2 weeks for follow up care and evaluation or sooner if problems arise.  Asencion Islam, DPM

## 2019-11-29 ENCOUNTER — Ambulatory Visit: Payer: Medicaid Other | Admitting: Sports Medicine

## 2019-11-30 ENCOUNTER — Telehealth: Payer: Self-pay

## 2019-11-30 ENCOUNTER — Other Ambulatory Visit: Payer: Self-pay | Admitting: Sports Medicine

## 2019-11-30 LAB — WOUND CULTURE

## 2019-11-30 MED ORDER — SULFAMETHOXAZOLE-TRIMETHOPRIM 800-160 MG PO TABS: 1.0000 | ORAL_TABLET | Freq: Two times a day (BID) | ORAL | 0 refills | Status: DC

## 2019-11-30 NOTE — Telephone Encounter (Signed)
Pt called back and he was informed of his positive wound cx results and was also told about bactrim sent to his pharmacy

## 2019-11-30 NOTE — Telephone Encounter (Signed)
LVM to pt stating to return our phone call to review his wound culture results and Dr.'s recommendations

## 2019-11-30 NOTE — Progress Notes (Signed)
Sent Bactrim for + wound culture -Dr. Kathie Rhodes

## 2019-12-06 ENCOUNTER — Ambulatory Visit: Payer: Medicaid Other | Admitting: Sports Medicine

## 2019-12-07 ENCOUNTER — Telehealth: Payer: Self-pay

## 2019-12-07 NOTE — Telephone Encounter (Signed)
Specimen #[id number] Annetta Maw from [facility] sent a fax requesting additional insurance information for this pt. Contacted Christy back at [phone] and informed her of pt's Medicaid [place] policy number

## 2020-01-05 DIAGNOSIS — L97423 Non-pressure chronic ulcer of left heel and midfoot with necrosis of muscle: Secondary | ICD-10-CM

## 2020-01-10 ENCOUNTER — Telehealth: Payer: Self-pay | Admitting: Podiatry

## 2020-01-10 NOTE — Telephone Encounter (Signed)
Patient wife called and stated that he would like to go home from [facility] instead of waiting for a specific medical device. Please call wife at  [phone]

## 2020-01-10 NOTE — Telephone Encounter (Signed)
Called and spoke to wife. Discussed importance of wound VAC and patient needing to stay until we get the machine for him

## 2020-01-12 ENCOUNTER — Other Ambulatory Visit: Payer: Self-pay

## 2020-01-12 ENCOUNTER — Ambulatory Visit (INDEPENDENT_AMBULATORY_CARE_PROVIDER_SITE_OTHER): Payer: Self-pay | Admitting: Podiatry

## 2020-01-12 DIAGNOSIS — L97423 Non-pressure chronic ulcer of left heel and midfoot with necrosis of muscle: Secondary | ICD-10-CM

## 2020-01-12 NOTE — Progress Notes (Signed)
  Subjective:  Patient ID: [name], male    DOB: 1971/03/14,  MRN: [id number]  No chief complaint on file.   49 y.o. male presents for wound care. Pain in the heel is doing better. Denies new issues or concerns. Presents with wound VAC.  Objective:  Physical Exam: Wound Location: left heel Wound Measurement: 2x2x1 Wound Base: Granular/Healthy Peri-wound: Normal Exudate: Moderate amount Serosanguinous exudate wound without warmth, erythema, signs of acute infection  Assessment:   1. Heel ulcer, left, with necrosis of muscle (HCC)      Plan:  Patient was evaluated and treated and all questions answered.  Ulcer left heel -Wound cleansed, wound VAC reapplied -Continue ABx -Dressed with ACE bandage. -F/u for wound VAC change Monday -No debridement indicated today.  Procedure: Wound VAC Application Location: left heel Wound Measurement: 2 cm x 2 cm x 1 cm  Technique: Black foam to wound base, followed by adherent dressing. Blakc foam bridge to lateral foot. Set to 125 mmHg with good seal noted. Disposition: Patient tolerated procedure well.  No follow-ups on file.

## 2020-01-19 ENCOUNTER — Ambulatory Visit: Payer: Self-pay | Admitting: Podiatry

## 2020-01-24 ENCOUNTER — Encounter: Payer: Self-pay | Admitting: Sports Medicine

## 2020-01-24 ENCOUNTER — Ambulatory Visit (INDEPENDENT_AMBULATORY_CARE_PROVIDER_SITE_OTHER): Payer: Self-pay | Admitting: Sports Medicine

## 2020-01-24 ENCOUNTER — Other Ambulatory Visit: Payer: Self-pay

## 2020-01-24 DIAGNOSIS — M79672 Pain in left foot: Secondary | ICD-10-CM

## 2020-01-24 DIAGNOSIS — E0810 Diabetes mellitus due to underlying condition with ketoacidosis without coma: Secondary | ICD-10-CM

## 2020-01-24 DIAGNOSIS — L03116 Cellulitis of left lower limb: Secondary | ICD-10-CM

## 2020-01-24 DIAGNOSIS — L97423 Non-pressure chronic ulcer of left heel and midfoot with necrosis of muscle: Secondary | ICD-10-CM

## 2020-01-24 NOTE — Progress Notes (Signed)
Subjective: [name] is a 49 y.o. male patient seen in office for follow up evaluation of ulceration of the left heel. Patient is s/p debridement with Dr. Samuella Cota application of wound vac. Reports that he was able to keep wpund vac in place but did have a leak so put a wet to dry dressing on. Patient reports that he can only come here weekly for vac changes, can no come 2x per week. Reports that he is taking his antibiotics with no issues.  Patient denies sweats/shortness of breath/pain. Patient has no other pedal complaints at this time.   Patient Active Problem List   Diagnosis Date Noted  . Cellulitis of left foot 10/19/2019  . DKA (diabetic ketoacidoses) 10/19/2019  . Hyponatremia 10/19/2019  . 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 Nausea & vomiting 10/19/2019  . Puncture wound of left foot 10/19/2019  . Ulcer of foot due to diabetes (HCC) 10/19/2019  . Uncontrolled type 2 diabetes mellitus (HCC) 10/19/2019  . Dyslipidemia 06/21/2019  . Insulin-requiring or dependent type II diabetes mellitus (HCC) 06/21/2019   Current Outpatient Medications on File Prior to Visit  Medication Sig Dispense Refill  . calcium carbonate (OSCAL) 1500 (600 Ca) MG TABS tablet Take by mouth 2 (two) times daily with a meal.    . cephALEXin (KEFLEX) 500 MG capsule Take 500 mg by mouth 4 (four) times daily.    . insulin aspart (NOVOLOG) 100 UNIT/ML injection Inject 12 Units into the skin. 2 times daily    . insulin detemir (LEVEMIR) 100 UNIT/ML FlexPen Inject into the skin. 32 units nightly    . Magnesium 250 MG TABS Take by mouth.    . metroNIDAZOLE (FLAGYL) 500 MG tablet Take 500 mg by mouth every 8 (eight) hours.    Marland Kitchen sulfamethoxazole-trimethoprim (BACTRIM DS) 800-160 MG tablet Take 1 tablet by mouth 2 (two) times daily. 28 tablet 0   No current facility-administered medications on file prior to visit.   Allergies  Allergen Reactions  . Piperacillin Hives  . Tazobactam Hives    Recent Results (from the  past 2160 hour(s))  WOUND CULTURE     Status: Abnormal   Collection Time: 11/22/19 12:37 PM   Specimen: Foot, Left; Wound   Wound Culture and sens  Result Value Ref Range   Gram Stain Result Final report    Organism ID, Bacteria Comment     Comment: No white blood cells seen.   Organism ID, Bacteria Comment     Comment: Many gram positive cocci.   Organism ID, Bacteria Comment     Comment: Moderate gram negative rods.   Aerobic Bacterial Culture Final report (A)    Organism ID, Bacteria Proteus mirabilis (A)     Comment: Cefazolin <=4 ug/mL Cefazolin with an MIC <=16 predicts susceptibility to the oral agents cefaclor, cefdinir, cefpodoxime, cefprozil, cefuroxime, cephalexin, and loracarbef when used for therapy of uncomplicated urinary tract infections due to E. coli, Klebsiella pneumoniae, and Proteus mirabilis. Moderate growth    Organism ID, Bacteria Staphylococcus aureus (A)     Comment: Moderate growth Most isolates of Staphylococcus sp. produce a beta-lactamase enzyme rendering them resistant to penicillin. Please contact the laboratory if penicillin is being considered for therapy.    Organism ID, Bacteria Comment (A)     Comment: Beta hemolytic Streptococcus, group B Heavy growth Penicillin and ampicillin are drugs of choice for treatment of beta-hemolytic streptococcal infections. Susceptibility testing of penicillins and other beta-lactam agents approved by the FDA for treatment of beta-hemolytic streptococcal  infections need not be performed routinely because nonsusceptible isolates are extremely rare in any beta-hemolytic streptococcus and have not been reported for Streptococcus pyogenes (group A). (CLSI)    Organism ID, Bacteria Mixed skin flora     Comment: Heavy growth   Antimicrobial Susceptibility Comment     Comment:       ** S = Susceptible; I = Intermediate; R = Resistant **                    P = Positive; N = Negative             MICS are expressed  in micrograms per mL    Antibiotic                 RSLT#1    RSLT#2    RSLT#3    RSLT#4 Amoxicillin/Clavulanic Acid    S Ampicillin                     S Cefepime                       S Ceftriaxone                    S Cefuroxime                     S Ciprofloxacin                  S         R Clindamycin                              R Ertapenem                      S Erythromycin                             R Gentamicin                     S         S Levofloxacin                   S         I Linezolid                                S Meropenem                      S Moxifloxacin                             I Oxacillin                                S Piperacillin/Tazobactam        S Quinupristin/Dalfopristin                S Rifampin                                 S Tetracycline  R         S Tobramycin                      S Trimethoprim/Sulfa             S         S Vancomycin                               S     Objective: There were no vitals filed for this visit.  General: Patient is awake, alert, oriented x 3 and in no acute distress.  Dermatology: Skin is warm and dry bilateral with a full thickness ulceration present  Left central heel, Ulceration measures 2x2x1.5cm.There is a mildly macerated and keratotic border with a granular base with no fatty tissue/muscle, granulation over the bone, no malodor, minimal clear active drainage, no erythema, mild edema.   Vascular: Dorsalis Pedis pulse = 2/4 Bilateral,  Posterior Tibial pulse = 1/4 Bilateral,  Capillary Fill Time < 5 seconds  Neurologic: Protective sensation diminished bilateral.  Musculosketal: There is mild pain with palpation to ulcerated area. No pain with compression to calves bilateral.   No results for input(s): GRAMSTAIN, LABORGA in the last 8760 hours.  Assessment and Plan:  Problem List Items Addressed This Visit      Musculoskeletal and Integument   Cellulitis of left foot     Other Visit Diagnoses    Heel ulcer, left, with necrosis of muscle (HCC)    -  Primary   Diabetic ketoacidosis without coma associated with diabetes mellitus due to underlying condition (HCC)       Left foot pain         -Examined patient and discussed the progression of the wound and treatment alternatives. - Excisionally dedbrided ulceration at Left plantar heel to healthy bleeding borders removing nonviable tissue using a sterile chisel blade to patient's tolerace. Wound measures post debridement as above. Wound was debrided to the level of the dermis with viable wound base exposed to promote healing. Hemostasis was achieved with manuel pressure. Patient tolerated procedure well without any discomfort or anesthesia necessary for this wound debridement.  -Applied wound vac set to with good seal noted covered with dry dressing to left heel; patient to keep clean dry and intact until next office dressing change -Continue with PO antibiotics until completed - Advised patient to go to the ER or return to office if the wound worsens or if constitutional symptoms are present. -Patient to return to office as scheduled for follow up evaluation/vac change or sooner if problems arise.  Asencion Islam, DPM

## 2020-02-02 ENCOUNTER — Ambulatory Visit (INDEPENDENT_AMBULATORY_CARE_PROVIDER_SITE_OTHER): Payer: Self-pay | Admitting: Podiatry

## 2020-02-02 ENCOUNTER — Encounter: Payer: Self-pay | Admitting: Podiatry

## 2020-02-02 DIAGNOSIS — L97423 Non-pressure chronic ulcer of left heel and midfoot with necrosis of muscle: Secondary | ICD-10-CM

## 2020-02-05 NOTE — Progress Notes (Signed)
  Subjective:  Patient ID: [name], male    DOB: 1970-05-20,  MRN: [id number]  Chief Complaint  Patient presents with  . Routine Post Op    wound vac    49 y.o. male presents for wound care.  States that there is an odor to the foot and the wound VAC was working. Objective:  Physical Exam: Wound Location: left heel Wound Measurement: 2x2x0.5 Wound Base: Granular/Healthy Peri-wound: Normal Exudate: Moderate amount Serosanguinous exudate wound without warmth, erythema, signs of acute infection  Assessment:   1. Heel ulcer, left, with necrosis of muscle (HCC)      Plan:  Patient was evaluated and treated and all questions answered.  Ulcer left heel -Wound cleansed, dressed with Betadine.  Unable to reply back to the edges of maceration.  Patient to reapply over the weekend as he states he can do it otherwise we will reapply it on Monday.  Wound does appear overall improved No follow-ups on file.

## 2020-02-09 ENCOUNTER — Ambulatory Visit: Payer: Medicaid Other | Admitting: Podiatry

## 2020-02-10 ENCOUNTER — Telehealth: Payer: Self-pay | Admitting: Podiatry

## 2020-02-10 NOTE — Telephone Encounter (Signed)
Pt's wife called stating pt received jury duty notice starting 02-20-20 she believes & is requesting a letter to excuse him from jury duty due to recent surgery. Pt is aware you are out of office until 02-13-20.

## 2020-02-13 ENCOUNTER — Encounter: Payer: Self-pay | Admitting: Podiatry

## 2020-02-13 NOTE — Telephone Encounter (Signed)
I am fine with that we can write him out

## 2020-02-17 ENCOUNTER — Ambulatory Visit (INDEPENDENT_AMBULATORY_CARE_PROVIDER_SITE_OTHER): Payer: Medicaid Other | Admitting: Sports Medicine

## 2020-02-17 ENCOUNTER — Encounter: Payer: Self-pay | Admitting: Sports Medicine

## 2020-02-17 ENCOUNTER — Other Ambulatory Visit: Payer: Self-pay

## 2020-02-17 DIAGNOSIS — L97423 Non-pressure chronic ulcer of left heel and midfoot with necrosis of muscle: Secondary | ICD-10-CM

## 2020-02-17 DIAGNOSIS — M79672 Pain in left foot: Secondary | ICD-10-CM

## 2020-02-17 DIAGNOSIS — E0810 Diabetes mellitus due to underlying condition with ketoacidosis without coma: Secondary | ICD-10-CM

## 2020-02-17 DIAGNOSIS — L03116 Cellulitis of left lower limb: Secondary | ICD-10-CM

## 2020-02-17 NOTE — Progress Notes (Signed)
Subjective: [name] is a 49 y.o. male patient seen in office for follow up evaluation of ulceration of the left heel. Patient is s/p debridement with Dr. Samuella Cota application of wound vac. Reports that he did his wound vac himself on last Saturday and did good and left it intact until today with no issues.  Patient denies sweats/shortness of breath/pain. Patient has no other pedal complaints at this time.   Patient Active Problem List   Diagnosis Date Noted  . Cellulitis of left foot 10/19/2019  . DKA (diabetic ketoacidoses) 10/19/2019  . Hyponatremia 10/19/2019  . 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 Nausea & vomiting 10/19/2019  . Puncture wound of left foot 10/19/2019  . Ulcer of foot due to diabetes (HCC) 10/19/2019  . Uncontrolled type 2 diabetes mellitus (HCC) 10/19/2019  . Dyslipidemia 06/21/2019  . Insulin-requiring or dependent type II diabetes mellitus (HCC) 06/21/2019   Current Outpatient Medications on File Prior to Visit  Medication Sig Dispense Refill  . calcium carbonate (OSCAL) 1500 (600 Ca) MG TABS tablet Take by mouth 2 (two) times daily with a meal.    . cephALEXin (KEFLEX) 500 MG capsule Take 500 mg by mouth 4 (four) times daily.    . insulin aspart (NOVOLOG) 100 UNIT/ML injection Inject 12 Units into the skin. 2 times daily    . insulin detemir (LEVEMIR) 100 UNIT/ML FlexPen Inject into the skin. 32 units nightly    . Magnesium 250 MG TABS Take by mouth.    . metroNIDAZOLE (FLAGYL) 500 MG tablet Take 500 mg by mouth every 8 (eight) hours.    Marland Kitchen sulfamethoxazole-trimethoprim (BACTRIM DS) 800-160 MG tablet Take 1 tablet by mouth 2 (two) times daily. 28 tablet 0   No current facility-administered medications on file prior to visit.   Allergies  Allergen Reactions  . Piperacillin Hives  . Tazobactam Hives    Recent Results (from the past 2160 hour(s))  WOUND CULTURE     Status: Abnormal   Collection Time: 11/22/19 12:37 PM   Specimen: Foot, Left; Wound   Wound  Culture and sens  Result Value Ref Range   Gram Stain Result Final report    Organism ID, Bacteria Comment     Comment: No white blood cells seen.   Organism ID, Bacteria Comment     Comment: Many gram positive cocci.   Organism ID, Bacteria Comment     Comment: Moderate gram negative rods.   Aerobic Bacterial Culture Final report (A)    Organism ID, Bacteria Proteus mirabilis (A)     Comment: Cefazolin <=4 ug/mL Cefazolin with an MIC <=16 predicts susceptibility to the oral agents cefaclor, cefdinir, cefpodoxime, cefprozil, cefuroxime, cephalexin, and loracarbef when used for therapy of uncomplicated urinary tract infections due to E. coli, Klebsiella pneumoniae, and Proteus mirabilis. Moderate growth    Organism ID, Bacteria Staphylococcus aureus (A)     Comment: Moderate growth Most isolates of Staphylococcus sp. produce a beta-lactamase enzyme rendering them resistant to penicillin. Please contact the laboratory if penicillin is being considered for therapy.    Organism ID, Bacteria Comment (A)     Comment: Beta hemolytic Streptococcus, group B Heavy growth Penicillin and ampicillin are drugs of choice for treatment of beta-hemolytic streptococcal infections. Susceptibility testing of penicillins and other beta-lactam agents approved by the FDA for treatment of beta-hemolytic streptococcal infections need not be performed routinely because nonsusceptible isolates are extremely rare in any beta-hemolytic streptococcus and have not been reported for Streptococcus pyogenes (group A). (CLSI)  Organism ID, Bacteria Mixed skin flora     Comment: Heavy growth   Antimicrobial Susceptibility Comment     Comment:       ** S = Susceptible; I = Intermediate; R = Resistant **                    P = Positive; N = Negative             MICS are expressed in micrograms per mL    Antibiotic                 RSLT#1    RSLT#2    RSLT#3    RSLT#4 Amoxicillin/Clavulanic Acid     S Ampicillin                     S Cefepime                       S Ceftriaxone                    S Cefuroxime                     S Ciprofloxacin                  S         R Clindamycin                              R Ertapenem                      S Erythromycin                             R Gentamicin                     S         S Levofloxacin                   S         I Linezolid                                S Meropenem                      S Moxifloxacin                             I Oxacillin                                S Piperacillin/Tazobactam        S Quinupristin/Dalfopristin                S Rifampin                                 S Tetracycline                   R         S Tobramycin  S Trimethoprim/Sulfa             S         S Vancomycin                               S     Objective: There were no vitals filed for this visit.  General: Patient is awake, alert, oriented x 3 and in no acute distress.  Dermatology: Skin is warm and dry bilateral with a full thickness ulceration present  Left central heel, Ulceration measures 3x2x1.0cm.There is a macerated and keratotic border with a granular base with no fatty tissue/muscle, granulation over the bone, no malodor, minimal clear active drainage, no erythema, mild edema.   To right dorsal foot there is a less than 0.5cm wound with a granular base with no signs of infection.   Vascular: Dorsalis Pedis pulse = 2/4 Bilateral,  Posterior Tibial pulse = 1/4 Bilateral,  Capillary Fill Time < 5 seconds  Neurologic: Protective sensation diminished bilateral.  Musculosketal: There is mild pain with palpation to ulcerated area on left>right. No pain with compression to calves bilateral.   No results for input(s): GRAMSTAIN, LABORGA in the last 8760 hours.  Assessment and Plan:  Problem List Items Addressed This Visit      Musculoskeletal and Integument   Cellulitis of left foot    Other  Visit Diagnoses    Heel ulcer, left, with necrosis of muscle (HCC)    -  Primary   Diabetic ketoacidosis without coma associated with diabetes mellitus due to underlying condition (HCC)       Left foot pain         -Examined patient and discussed the progression of the wound and treatment alternatives. - Excisionally dedbrided ulceration at Left plantar heel to healthy bleeding borders removing nonviable tissue using a sterile chisel blade to patient's tolerace. Wound measures post debridement as above. Wound was debrided to the level of the dermis with viable wound base exposed to promote healing. Hemostasis was achieved with manuel pressure. Patient tolerated procedure well without any discomfort or anesthesia necessary for this wound debridement.  -Applied wound vac set to with good seal noted covered with dry dressing to left heel; patient to keep clean dry and intact until next office dressing change of which he will do himself next week -Advised medihoney to right foot with bandaid  - Advised patient to go to the ER or return to office if the wound worsens or if constitutional symptoms are present. -Patient to return to office as scheduled for follow up evaluation/vac change or sooner if problems arise.  Asencion Islam, DPM

## 2020-03-05 ENCOUNTER — Ambulatory Visit (INDEPENDENT_AMBULATORY_CARE_PROVIDER_SITE_OTHER): Payer: Medicaid Other | Admitting: Podiatry

## 2020-03-05 DIAGNOSIS — Z5329 Procedure and treatment not carried out because of patient's decision for other reasons: Secondary | ICD-10-CM

## 2020-03-06 NOTE — Progress Notes (Signed)
   Complete physical exam  Patient: [name]   DOB: 01/25/2001   49 y.o. Male  MRN: [id number]  Subjective:    No chief complaint on file.   [name] is a 49 y.o. male who presents today for a complete physical exam. She reports consuming a {diet types:17450} diet. {types:19826} She generally feels {DESC; WELL/FAIRLY WELL/POORLY:18703}. She reports sleeping {DESC; WELL/FAIRLY WELL/POORLY:18703}. She {does/does not:200015} have additional problems to discuss today.    Most recent fall risk assessment:    10/02/2021   10:42 AM  Fall Risk   Falls in the past year? 0  Number falls in past yr: 0  Injury with Fall? 0  Risk for fall due to : No Fall Risks  Follow up Falls evaluation completed     Most recent depression screenings:    10/02/2021   10:42 AM 08/23/2020   10:46 AM  PHQ 2/9 Scores  PHQ - 2 Score 0 0  PHQ- 9 Score 5     {VISON DENTAL STD PSA (Optional):27386}  {History (Optional):23778}  Patient Care Team: Jessup, Joy, NP as PCP - General (Nurse Practitioner)   Outpatient Medications Prior to Visit  Medication Sig   fluticasone (FLONASE) 50 MCG/ACT nasal spray Place 2 sprays into both nostrils in the morning and at bedtime. After 7 days, reduce to once daily.   norgestimate-ethinyl estradiol (SPRINTEC 28) 0.25-35 MG-MCG tablet Take 1 tablet by mouth daily.   Nystatin POWD Apply liberally to affected area 2 times per day   spironolactone (ALDACTONE) 100 MG tablet Take 1 tablet (100 mg total) by mouth daily.   No facility-administered medications prior to visit.    ROS        Objective:     There were no vitals taken for this visit. {Vitals History (Optional):23777}  Physical Exam   No results found for any visits on 11/07/21. {Show previous labs (optional):23779}    Assessment & Plan:    Routine Health Maintenance and Physical Exam  Immunization History  Administered Date(s) Administered   DTaP 04/10/1999, 06/06/1999,  08/15/1999, 04/30/2000, 11/14/2003   Hepatitis A 09/10/2007, 09/15/2008   Hepatitis B 01/26/1999, 03/05/1999, 08/15/1999   HiB (PRP-OMP) 04/10/1999, 06/06/1999, 08/15/1999, 04/30/2000   IPV 04/10/1999, 06/06/1999, 02/03/2000, 11/14/2003   Influenza,inj,Quad PF,6+ Mos 12/16/2013   Influenza-Unspecified 03/17/2012   MMR 02/02/2001, 11/14/2003   Meningococcal Polysaccharide 09/15/2011   Pneumococcal Conjugate-13 04/30/2000   Pneumococcal-Unspecified 08/15/1999, 10/29/1999   Tdap 09/15/2011   Varicella 02/03/2000, 09/10/2007    Health Maintenance  Topic Date Due   HIV Screening  Never done   Hepatitis C Screening  Never done   INFLUENZA VACCINE  11/05/2021   PAP-Cervical Cytology Screening  11/07/2021 (Originally 01/25/2020)   PAP SMEAR-Modifier  11/07/2021 (Originally 01/25/2020)   TETANUS/TDAP  11/07/2021 (Originally 09/14/2021)   HPV VACCINES  Discontinued   COVID-19 Vaccine  Discontinued    Discussed health benefits of physical activity, and encouraged her to engage in regular exercise appropriate for her age and condition.  Problem List Items Addressed This Visit   None Visit Diagnoses     Annual physical exam    -  Primary   Cervical cancer screening       Need for Tdap vaccination          No follow-ups on file.     Joy Jessup, NP   

## 2020-05-09 DIAGNOSIS — E1165 Type 2 diabetes mellitus with hyperglycemia: Secondary | ICD-10-CM

## 2020-05-09 DIAGNOSIS — E11628 Type 2 diabetes mellitus with other skin complications: Secondary | ICD-10-CM

## 2020-05-09 DIAGNOSIS — L0291 Cutaneous abscess, unspecified: Secondary | ICD-10-CM

## 2020-05-09 DIAGNOSIS — M869 Osteomyelitis, unspecified: Secondary | ICD-10-CM

## 2020-05-10 DIAGNOSIS — M86672 Other chronic osteomyelitis, left ankle and foot: Secondary | ICD-10-CM

## 2020-05-10 DIAGNOSIS — L97424 Non-pressure chronic ulcer of left heel and midfoot with necrosis of bone: Secondary | ICD-10-CM

## 2020-05-11 ENCOUNTER — Telehealth: Payer: Self-pay | Admitting: Sports Medicine

## 2020-05-11 NOTE — Telephone Encounter (Signed)
Pt's wife would like to speak to you to get detailed information about  patient's care & surgery scheduled for Monday

## 2020-05-11 NOTE — Telephone Encounter (Signed)
Thank you for doing so.

## 2020-05-11 NOTE — Telephone Encounter (Signed)
FYI I called the wife to update her

## 2020-05-14 ENCOUNTER — Telehealth: Payer: Self-pay | Admitting: Podiatry

## 2020-05-14 NOTE — Telephone Encounter (Signed)
Pt's wife is wanting to know when surgery is going to be.  Spoke with Dr. Marylene Land and she will let me know if her or Dr. Samuella Cota will be doing surgery tomorrow.

## 2020-05-15 DIAGNOSIS — L02612 Cutaneous abscess of left foot: Secondary | ICD-10-CM

## 2020-05-15 DIAGNOSIS — M86172 Other acute osteomyelitis, left ankle and foot: Secondary | ICD-10-CM

## 2020-05-15 DIAGNOSIS — L97529 Non-pressure chronic ulcer of other part of left foot with unspecified severity: Secondary | ICD-10-CM

## 2020-05-15 NOTE — Telephone Encounter (Signed)
Dr Marylene Land has spoken with staff at hospital & they will be speaking with pt/reb

## 2020-05-17 ENCOUNTER — Encounter: Payer: Self-pay | Admitting: Sports Medicine

## 2020-05-17 ENCOUNTER — Telehealth: Payer: Self-pay | Admitting: Urology

## 2020-05-17 NOTE — Telephone Encounter (Signed)
Pt called and said you need a Fax # for him and you knew what he needed  Fax #[phone] attn Marcelle Smiling

## 2020-05-17 NOTE — Telephone Encounter (Signed)
I sure will

## 2020-05-17 NOTE — Telephone Encounter (Signed)
Will you send a letter for me to the fax that he gave stating that he had surgery and was discharged from hospital on 05/16/20 and will be out of work estimated time of 1 month. Thanks Dr. Kathie Rhodes

## 2020-05-21 ENCOUNTER — Encounter: Payer: Self-pay | Admitting: Podiatry

## 2020-05-22 ENCOUNTER — Telehealth: Payer: Self-pay | Admitting: Podiatry

## 2020-05-22 NOTE — Telephone Encounter (Signed)
Pt's wife is requesting to be updated on patients work status & an approx time he will be out of work.  Is applying for assistance with meds and hospital charges.

## 2020-05-22 NOTE — Telephone Encounter (Signed)
I anticipate he be out of work at least 1 month as he is healing. He is NWB to the heel

## 2020-05-28 ENCOUNTER — Ambulatory Visit (INDEPENDENT_AMBULATORY_CARE_PROVIDER_SITE_OTHER): Payer: Medicaid Other

## 2020-05-28 ENCOUNTER — Encounter: Payer: Self-pay | Admitting: Podiatry

## 2020-05-28 ENCOUNTER — Other Ambulatory Visit: Payer: Self-pay

## 2020-05-28 ENCOUNTER — Ambulatory Visit (INDEPENDENT_AMBULATORY_CARE_PROVIDER_SITE_OTHER): Payer: Self-pay | Admitting: Podiatry

## 2020-05-28 DIAGNOSIS — L0291 Cutaneous abscess, unspecified: Secondary | ICD-10-CM | POA: Insufficient documentation

## 2020-05-28 DIAGNOSIS — D649 Anemia, unspecified: Secondary | ICD-10-CM | POA: Insufficient documentation

## 2020-05-28 DIAGNOSIS — M869 Osteomyelitis, unspecified: Secondary | ICD-10-CM | POA: Insufficient documentation

## 2020-05-28 DIAGNOSIS — W57XXXA Bitten or stung by nonvenomous insect and other nonvenomous arthropods, initial encounter: Secondary | ICD-10-CM | POA: Insufficient documentation

## 2020-05-28 DIAGNOSIS — L0390 Cellulitis, unspecified: Secondary | ICD-10-CM | POA: Insufficient documentation

## 2020-05-28 DIAGNOSIS — E440 Moderate protein-calorie malnutrition: Secondary | ICD-10-CM | POA: Insufficient documentation

## 2020-05-28 DIAGNOSIS — L97424 Non-pressure chronic ulcer of left heel and midfoot with necrosis of bone: Secondary | ICD-10-CM

## 2020-05-28 DIAGNOSIS — L97423 Non-pressure chronic ulcer of left heel and midfoot with necrosis of muscle: Secondary | ICD-10-CM

## 2020-05-28 NOTE — Progress Notes (Signed)
  Subjective:  Patient ID: [name], male    DOB: 05-10-1970,  MRN: [id number]  Chief Complaint  Patient presents with  . Routine Post Op    I am doing ok and there is some bleeding thru the bandages and I did change out the wound vac on Saturday   50 y.o. male presents for wound care. Hx confirmed with patient. He has not been on antibiotics and has now been without them for approximately 2.5 weeks. He has been applying the wound VAC himself. Objective:  Physical Exam: Wound Location: left hee Peri-wound: Reddened, Macerated Exudate: Large amount Bloody exudate + induration, erythema noted along wound margins extending 3 cm and undermining noted  The wound VAC drape was applied directly to the wound without a sponge. There is significant sanguinous drainage. The entire heel is macerated. There is malodor. The wound does probe deep to bone. Broken sutures noted along the wound margins.   No images are attached to the encounter.  Radiographs:  X-ray of the right foot: defined surgical resection margin without frank lysis, no area of acute bone breakdown noted. Assessment:   1. Heel ulcer, left, with necrosis of bone (HCC)    Plan:  Patient was evaluated and treated and all questions answered.  Ulcer Left heel -XR reviewed with patient -Continue NWB left heel -He has been without antibiotics for 2.5 weeks. He has redness to his heel. For this infection with high risk of further amputation this is not acceptable. -He has not been caring for his wound correctly. He has been applying the wound VAC inapropriately which is likely causing more harm than good. I discussed I want him to stop applying the wound VAC. -I think if he is to have a chance at salvage at this point he will need to go back to ED. He will need IV Abx and an MRI. He may benefit from surgical debridement if the infection has not spread substantially. If it has indeed spread I am concerned he will need amputation of  the foot in its entirety.  No follow-ups on file.

## 2020-05-30 ENCOUNTER — Telehealth: Payer: Self-pay | Admitting: Podiatry

## 2020-05-30 DIAGNOSIS — L02612 Cutaneous abscess of left foot: Secondary | ICD-10-CM

## 2020-05-30 DIAGNOSIS — M86172 Other acute osteomyelitis, left ankle and foot: Secondary | ICD-10-CM

## 2020-05-30 NOTE — Telephone Encounter (Signed)
Medical Records [facility] is needing operative report for 05-10-20.  They are aware you are out of office until Monday.

## 2020-06-04 ENCOUNTER — Other Ambulatory Visit: Payer: Self-pay

## 2020-06-04 ENCOUNTER — Ambulatory Visit (INDEPENDENT_AMBULATORY_CARE_PROVIDER_SITE_OTHER): Payer: Self-pay | Admitting: Podiatry

## 2020-06-04 DIAGNOSIS — L97423 Non-pressure chronic ulcer of left heel and midfoot with necrosis of muscle: Secondary | ICD-10-CM

## 2020-06-04 DIAGNOSIS — L97424 Non-pressure chronic ulcer of left heel and midfoot with necrosis of bone: Secondary | ICD-10-CM

## 2020-06-04 DIAGNOSIS — M86672 Other chronic osteomyelitis, left ankle and foot: Secondary | ICD-10-CM

## 2020-06-04 NOTE — Progress Notes (Signed)
  Subjective:  Patient ID: [name], male    DOB: 01-13-1971,  MRN: [id number]  Chief Complaint  Patient presents with  . Routine Post Op    POV -pt denies N/V/F/Ch -pt dneies pain/complaints - pt states," doing fine, no pain." - dressing intact -pt states he is being complaint with NWB   50 y.o. male presents for wound care. Hx confirmed with patient. Thinks he is doing much better since starting Abx. Objective:  Physical Exam: Wound Location: left heel wound healing without active warmth or redness. No purulence expressible. Skin edges coapted with sutures. Assessment:   1. Heel ulcer, left, with necrosis of bone (HCC)   2. Heel ulcer, left, with necrosis of muscle (HCC)   3. Other chronic osteomyelitis of left ankle (HCC)    Plan:  Patient was evaluated and treated and all questions answered.  Ulcer Left heel -Cultures with MSSA and proteus. -This does appear to be much improved compared to previous -Continue IV Abx. Refer to ID. Continue IVs with PICC line. -Discussed he does appear to be doing better but there is a real risk he may need a BKA. -Dressed with betadine WTD today.  No follow-ups on file.

## 2020-06-11 ENCOUNTER — Ambulatory Visit (INDEPENDENT_AMBULATORY_CARE_PROVIDER_SITE_OTHER): Payer: Self-pay | Admitting: Podiatry

## 2020-06-11 ENCOUNTER — Other Ambulatory Visit: Payer: Self-pay

## 2020-06-11 DIAGNOSIS — D510 Vitamin B12 deficiency anemia due to intrinsic factor deficiency: Secondary | ICD-10-CM | POA: Insufficient documentation

## 2020-06-11 DIAGNOSIS — L97424 Non-pressure chronic ulcer of left heel and midfoot with necrosis of bone: Secondary | ICD-10-CM

## 2020-06-11 DIAGNOSIS — M8610 Other acute osteomyelitis, unspecified site: Secondary | ICD-10-CM | POA: Insufficient documentation

## 2020-06-11 DIAGNOSIS — M86172 Other acute osteomyelitis, left ankle and foot: Secondary | ICD-10-CM | POA: Insufficient documentation

## 2020-06-11 NOTE — Progress Notes (Signed)
  Subjective:  Patient ID: [name], male    DOB: [birth year],  MRN: [id number]  Chief Complaint  Patient presents with  . Routine Post Op    POV -pt denies N/V/F/Ch -per pt feeling fine no pain -pt states he has been compliance with NWb   50 y.o. male presents for wound care. Hx confirmed with patient. Reports compliance with abx and WB restrictions. Objective:  Physical Exam: Wound Location: left heel wound healing without active warmth or redness. No purulence expressible. Skin edges coapted with sutures. Strong malodor noted today. Edema without warmth. No purulence. Assessment:   1. Heel ulcer, left, with necrosis of bone (HCC)    Plan:  Patient was evaluated and treated and all questions answered.  Ulcer Left heel -Continue IV abx. Has appt with ID. -He does not have frank signs of infection today but the wound is macerated and malodorous. Change to betaine WTD every day.  -I grow increasingly concerned his foot will not be salvageable. Will monitor.  No follow-ups on file.

## 2020-06-14 ENCOUNTER — Ambulatory Visit: Payer: Self-pay | Admitting: Infectious Diseases

## 2020-06-14 ENCOUNTER — Telehealth: Payer: Self-pay | Admitting: Podiatry

## 2020-06-14 NOTE — Telephone Encounter (Signed)
Pt was given antibiotics (Flagyl) by Dr. Lorane Gell when pt was in hospital & they are wanting to know does he need to continue these.  If so they need a refill sen to [facility] Dr

## 2020-06-15 ENCOUNTER — Other Ambulatory Visit: Payer: Self-pay

## 2020-06-15 ENCOUNTER — Ambulatory Visit (INDEPENDENT_AMBULATORY_CARE_PROVIDER_SITE_OTHER): Payer: Self-pay | Admitting: Internal Medicine

## 2020-06-15 ENCOUNTER — Encounter: Payer: Self-pay | Admitting: Internal Medicine

## 2020-06-15 ENCOUNTER — Telehealth: Payer: Self-pay

## 2020-06-15 VITALS — BP 166/82 | HR 85 | Wt 160.0 lb

## 2020-06-15 DIAGNOSIS — L089 Local infection of the skin and subcutaneous tissue, unspecified: Secondary | ICD-10-CM

## 2020-06-15 DIAGNOSIS — E11628 Type 2 diabetes mellitus with other skin complications: Secondary | ICD-10-CM

## 2020-06-15 DIAGNOSIS — M869 Osteomyelitis, unspecified: Secondary | ICD-10-CM

## 2020-06-15 DIAGNOSIS — R600 Localized edema: Secondary | ICD-10-CM

## 2020-06-15 MED ORDER — METRONIDAZOLE 500 MG PO TABS: 500.0000 mg | ORAL_TABLET | Freq: Three times a day (TID) | ORAL | 0 refills | Status: DC

## 2020-06-15 NOTE — Progress Notes (Addendum)
[facility]  Reason for Consult:left heel ulcer/infection Referring Provider: price    Patient Active Problem List   Diagnosis Date Noted  . Acute osteomyelitis of left calcaneus (HCC) 06/11/2020  . Pernicious anemia 06/11/2020  . Abscess 05/28/2020  . Cellulitis 05/28/2020  . Anemia 05/28/2020  . Moderate protein-calorie malnutrition (HCC) 05/28/2020  . Osteomyelitis (HCC) 05/28/2020  . Tick bite 05/28/2020  . Cellulitis of left foot 10/19/2019  . DKA (diabetic ketoacidoses) 10/19/2019  . Hyponatremia 10/19/2019  . 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 Nausea & vomiting 10/19/2019  . Puncture wound of left foot 10/19/2019  . Ulcer of foot due to diabetes (HCC) 10/19/2019  . Uncontrolled type 2 diabetes mellitus (HCC) 10/19/2019  . Dyslipidemia 06/21/2019  . Insulin-requiring or dependent type II diabetes mellitus (HCC) 06/21/2019      HPI: [name] is a 50 y.o. male dm2 referred by podiatry for left calcaneous OM   More than 1 year progressive left heel ulcer Followed by podiatry Has had a few debridements Was on oral antibiotics off and on  Prior mri did show osteomyeltitis  He developed fever and progressive ulcer/pain/redness/pus discharge and was admitted 2/21 to [facility] so admitted. Underwent I&D  I reviewed progress note on epic. There is only patient AVS from the recent admission.   He showed me initial picture of the deep ulcer 2.5 inch  Recently started on iv abx ceftriaxone/metronidazole during 05/28/20 admission  Overall pain better. No more redness. Incision intact. Still has suture/staples  Nonsmoking No prior vascular evaluation  I don't have any of his labs today He has [facility] [place] His rue picc site is not painful and not having issue with it  No n/v/diarrhea/rash  Review of Systems: ROS Other ros negative      Past Medical History:  Diagnosis Date  . Diabetes mellitus without  complication (HCC)   . Kidney disease     Social History   Tobacco Use  . Smoking status: Former Games developer  . Smokeless tobacco: Never Used  Substance Use Topics  . Alcohol use: No  . Drug use: No    No family history on file.  Allergies  Allergen Reactions  . Piperacillin Hives  . Tazobactam Hives    OBJECTIVE: Vitals:   06/15/20 1610  Weight: 160 lb (72.6 kg)   There is no height or weight on file to calculate BMI.   Physical Exam Constitutional:      Appearance: Normal appearance. He is normal weight.  HENT:     Head: Normocephalic.     Mouth/Throat:     Mouth: Mucous membranes are moist.     Pharynx: Oropharynx is clear.  Eyes:     Conjunctiva/sclera: Conjunctivae normal.     Pupils: Pupils are equal, round, and reactive to light.  Cardiovascular:     Rate and Rhythm: Normal rate and regular rhythm.  Pulmonary:     Effort: Pulmonary effort is normal.     Breath sounds: Normal breath sounds.  Abdominal:     General: Abdomen is flat.     Palpations: Abdomen is soft.  Musculoskeletal:     Cervical back: Normal range of motion.     Comments: Left heel incision intact no dehiscence Left foot/LE swelling/edema 1+ No redness/tenderness  Skin:    Findings: No rash.     Comments: RUE picc site no tenderness/purulence/redness  Neurological:     General: No focal deficit present.  Mental Status: He is oriented to person, place, and time.     Cranial Nerves: No cranial nerve deficit.  Psychiatric:        Mood and Affect: Mood normal.        Behavior: Behavior normal.     Lab:  Microbiology: 11/2019 heel ulcer mixed skin flora, mssa (S tetra), proteus mirabilis (S ampicillin, levo)  Serology:  Imaging:   Assessment/plan: Problem List Items Addressed This Visit      Musculoskeletal and Integument   Osteomyelitis (HCC)    Other Visit Diagnoses    Diabetic foot infection (HCC)    -  Primary      Discussed natural history and pathogenesis of  the diabetic foot infection, and especially poor prognosis of calcaneal ulcer/OM  Discuss high risk for requirement of bka  He is s/p recent I&D 05/28/2020 admission Previous cx before the I&D grew mssa and proteus  Now can try continue tx 6 weeks ceftriaxone/metronidazole. He appears to have controlled infection at this time  Will review his hospital record once available  Continue f/u with podiatry Will base abx duration 6 weeks from 06/03/20 until 07/16/2020 Will have his [facility]) fax labs to [place] cbc, cmp, crp  F/u 4 weeks Advise him to discuss with podiatry to get vascular u/s lle edema will get duplex u/s     I spent 60 minute reviewing data/chart, and coordinating care and >50% direct face to face time providing counseling/discussing diagnostics/treatment plan with patient        Follow-up: Return in about 4 weeks (around 07/13/2020).  Raymondo Band, MD [facility] [phone] pager   [phone] cell 06/15/2020, 4:13 PM

## 2020-06-15 NOTE — Addendum Note (Signed)
Addended by: Ventura Sellers on: 06/15/2020 01:59 PM   Modules accepted: Orders

## 2020-06-15 NOTE — Telephone Encounter (Signed)
Per Dr. Renold Don, release of information paperwork faxed to [facility] for hospital visit information (labs, cultures, surgical notes etc) from recent hospitalization.   Yukie Bergeron Loyola Mast, RN

## 2020-06-15 NOTE — Telephone Encounter (Signed)
Refilled Flagyl x2 weeks

## 2020-06-15 NOTE — Patient Instructions (Addendum)
It appears your foot infection appears to be better  Continue to avoid putting weight on the left foot Continue taking ceftriaxone and metronidazole  Please discuss with your foot doctor to get a vascular ultrasound to evaluate your blood vessel  I will order another type of ultrasound to rule out blood clot  Please have [facility] fax weekly labs (cbc, cmp, and crp) to our clinic   Follow up in 3-4 weeks with me  Will try to get records (mri/operative note/discharge summary)   You can take metronidazole just twice a day, and continue ceftriaxone once a day IV

## 2020-06-18 ENCOUNTER — Ambulatory Visit (INDEPENDENT_AMBULATORY_CARE_PROVIDER_SITE_OTHER): Payer: Self-pay | Admitting: Podiatry

## 2020-06-18 ENCOUNTER — Ambulatory Visit (HOSPITAL_COMMUNITY): Admission: RE | Admit: 2020-06-18 | Payer: Medicaid Other | Source: Ambulatory Visit

## 2020-06-18 ENCOUNTER — Other Ambulatory Visit: Payer: Self-pay

## 2020-06-18 DIAGNOSIS — L97424 Non-pressure chronic ulcer of left heel and midfoot with necrosis of bone: Secondary | ICD-10-CM

## 2020-06-18 NOTE — Progress Notes (Signed)
  Subjective:  Patient ID: [name], male    DOB: 1970-11-24,  MRN: [id number]  Chief Complaint  Patient presents with  . Routine Post Op    POV #5 -pt denies N/V/F/Ch -pt states,: foot looking better and feels better." - less swelling -no redness/drianage tx: betadine WTD    50 y.o. male presents for wound care. Hx confirmed with patient. Saw ID. Objective:  Physical Exam: Wound Location: left heel wound healing without active warmth or redness. No purulence expressible. Skin edges coapted with sutures. Mild malodor noted today. Mod sanguinous drainage. Assessment:   1. Heel ulcer, left, with necrosis of bone (HCC)    Plan:  Patient was evaluated and treated and all questions answered.  Ulcer Left heel -Continue IV abx per ID -His foot is actually doing better. He is being compliant with NWB precautions. He is taking his abx. Hopefully the foot will continue to improve and do well. Will see how it looks once abx are complete. -Suture/staple removal next visit. -Continue betadine WTD. -Per Dr. Renold Don we discussed a vascular ultrasound. Gross assessment is reassuring for healing from a vascular perspective however we will order TBI/ABI.   Return in about 2 weeks (around 07/02/2020) for Post-Op (No XRs) suture removal.

## 2020-06-19 ENCOUNTER — Telehealth: Payer: Self-pay

## 2020-06-19 NOTE — Telephone Encounter (Signed)
-----   Message from Park Liter, DPM sent at 06/18/2020  1:51 PM EDT ----- Can we order vascular US at [place]? Order in

## 2020-06-19 NOTE — Telephone Encounter (Signed)
Have faxed to [facility] Patient's last office note, demographic, order sheet and vascular order sheet to Fax# [phone]

## 2020-06-22 ENCOUNTER — Other Ambulatory Visit: Payer: Self-pay | Admitting: Internal Medicine

## 2020-06-22 NOTE — Progress Notes (Signed)
Outside record received and reviewed    05/28/2020 [facility] admission for diabetic foot infection  2/23 underwent I&D with bone path Operative culture: mssa (S tetra, bactrim, linezolid); proteus vulgaris (r amp, cefazolin, cefuroxime; I imipenem; s meropenem, bactrim, cipro); mixed anaerobic organisms non predominating (not gram stained positive or negative)  Pathology: bone with reparative change, hypocellular marrow wth fat necrosis and patchy aggregates of plasma cells c/w resolving osteomelitis. No malignancy   2/21 mri left foot Interval surgical resection posterior calcaneous. Extensive bone marrow signal changes throughout remaining calcaneous compatible with acute osteomyelitis Ulceration or surgical wound dehiscence plantar aspect left heel with extension to resection margin of posterior calcaneous and surrounding soft tissue edema/enhancement. No fluid collection Small posterior subtalar joint effusion Complete tear vs surgical release of distal achilles tendon   Discharged 2/25 on ceftriaxone/flagyl  Previously 05/09/2020 given 14 days bactrim/flagyl after I&D on 2/03, partial calcanectomy, and primary closure Operative culture: mssa (S linezolid, tetra, bactrim; I levo), provotella species   Esr/crp 2/02      111/     Patient on ceftriaxone/flagyl. Will see how he does, might need cefepime flagyl given proteus vulgaris, but not necessarily all proteus will de-repress their amp-c

## 2020-06-25 ENCOUNTER — Encounter: Payer: Medicaid Other | Admitting: Podiatry

## 2020-06-26 ENCOUNTER — Encounter: Payer: Self-pay | Admitting: Podiatry

## 2020-07-02 ENCOUNTER — Other Ambulatory Visit: Payer: Self-pay

## 2020-07-02 ENCOUNTER — Ambulatory Visit (INDEPENDENT_AMBULATORY_CARE_PROVIDER_SITE_OTHER): Payer: Self-pay | Admitting: Podiatry

## 2020-07-02 VITALS — BP 163/94 | HR 125 | Temp 97.1°F | Resp 16

## 2020-07-02 DIAGNOSIS — M86672 Other chronic osteomyelitis, left ankle and foot: Secondary | ICD-10-CM

## 2020-07-02 DIAGNOSIS — L97424 Non-pressure chronic ulcer of left heel and midfoot with necrosis of bone: Secondary | ICD-10-CM

## 2020-07-02 NOTE — Progress Notes (Signed)
  Subjective:  Patient ID: [name], male    DOB: 1970-04-10,  MRN: [id number]  Chief Complaint  Patient presents with  . Routine Post Op    POV -pt denies N/V/F/Ch -pt denies pain -per pt foot is doing well -no odor/draiange and less swelling    50 y.o. male presents for wound care. Hx confirmed with patient. Objective:  Physical Exam: Wound Location: left heel wound healing without active warmth. Chronic appearing posterior redness. No purulence expressible. Skin edges coapted with sutures. No malodor noted today. Mod sanguinous drainage. Assessment:   1. Heel ulcer, left, with necrosis of bone (HCC)   2. Other chronic osteomyelitis of left ankle (HCC)    Plan:  Patient was evaluated and treated and all questions answered.  Ulcer Left heel -Continue IV abx per ID -Continue betadine WTD. -Partial staple removal, majority left intact. Retention sutures intact. -Wound continues to heal, albeit slowly. There was a moderate amount of drainage today. Dressed with betadine WTD.  -Further staple/suture removal in 2 weeks.  Return in about 2 weeks (around 07/16/2020) for Post-Op (No XRs).

## 2020-07-03 ENCOUNTER — Telehealth: Payer: Self-pay

## 2020-07-03 ENCOUNTER — Telehealth: Payer: Self-pay | Admitting: Podiatry

## 2020-07-03 ENCOUNTER — Ambulatory Visit (INDEPENDENT_AMBULATORY_CARE_PROVIDER_SITE_OTHER): Payer: Self-pay | Admitting: Internal Medicine

## 2020-07-03 DIAGNOSIS — M86172 Other acute osteomyelitis, left ankle and foot: Secondary | ICD-10-CM

## 2020-07-03 DIAGNOSIS — M7989 Other specified soft tissue disorders: Secondary | ICD-10-CM

## 2020-07-03 DIAGNOSIS — E11628 Type 2 diabetes mellitus with other skin complications: Secondary | ICD-10-CM

## 2020-07-03 DIAGNOSIS — L089 Local infection of the skin and subcutaneous tissue, unspecified: Secondary | ICD-10-CM

## 2020-07-03 MED ORDER — CEFDINIR 300 MG PO CAPS: 300.0000 mg | ORAL_CAPSULE | Freq: Two times a day (BID) | ORAL | 1 refills | Status: AC

## 2020-07-03 MED ORDER — LEVOFLOXACIN 750 MG PO TABS: 750.0000 mg | ORAL_TABLET | Freq: Every day | ORAL | 1 refills | Status: AC

## 2020-07-03 NOTE — Telephone Encounter (Signed)
Received call from [facility] with [facility] regarding faxed orders. Per Jasmine December patient has never received home health care from them. Not able to pull picc or draw labs.  Spoke with patient who states he was seen by speciality procedures not home health. Spoke with Beth at [facility] who states they are able to pull picc and draw labs. Orders must be signed by a provider who has special procedures at [facility]. Not able to accepts ID providers signature . Is able to accept orders from podiatry team.  Called and left message with Rn to see if they are able to do this.

## 2020-07-03 NOTE — Progress Notes (Signed)
[facility]  Reason for Consult:left heel ulcer/infection Referring Provider: price    Patient Active Problem List   Diagnosis Date Noted  . Acute osteomyelitis of left calcaneus (HCC) 06/11/2020  . Pernicious anemia 06/11/2020  . Abscess 05/28/2020  . Cellulitis 05/28/2020  . Anemia 05/28/2020  . Moderate protein-calorie malnutrition (HCC) 05/28/2020  . Osteomyelitis (HCC) 05/28/2020  . Tick bite 05/28/2020  . Cellulitis of left foot 10/19/2019  . DKA (diabetic ketoacidoses) 10/19/2019  . Hyponatremia 10/19/2019  . 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 Nausea & vomiting 10/19/2019  . Puncture wound of left foot 10/19/2019  . Ulcer of foot due to diabetes (HCC) 10/19/2019  . Uncontrolled type 2 diabetes mellitus (HCC) 10/19/2019  . Dyslipidemia 06/21/2019  . Insulin-requiring or dependent type II diabetes mellitus (HCC) 06/21/2019      HPI: [name] is a 50 y.o. male dm2 referred by podiatry for left calcaneous OM. He is here today for ongoing ID care  07/03/20 id f/u Patient was continued to ceftriaxone/flagyl Reviewed microbiology [facility] There was proteus vulgaris which is amp-c organism He has no other lab tests on iv abx [facility] in [place] clinic is where he gets iv antibiotics.  There is still discharge mainly bloody. Over all he thinks the foot wound is better, but the past few days the incision is getting more red And also the clinic where he gets his iv ceftriaxone had stopped giving him iv ceftriaxone 1 week before.  No f/c/n/v/diarrhea    Background from 06/15/20 id consult --------- More than 1 year progressive left heel ulcer Followed by podiatry Has had a few debridements Was on oral antibiotics off and on  Prior mri did show osteomyeltitis  He developed fever and progressive ulcer/pain/redness/pus discharge and was admitted 2/21 to [facility] so admitted. Underwent I&D  I  reviewed progress note on epic. There is only patient AVS from the recent admission.   He showed me initial picture of the deep ulcer 2.5 inch  Recently started on iv abx ceftriaxone/metronidazole during 05/28/20 admission  Overall pain better. No more redness. Incision intact. Still has suture/staples  Nonsmoking No prior vascular evaluation  I don't have any of his labs today He has [facility] His rue picc site is not painful and not having issue with it  No n/v/diarrhea/rash  Review of Systems: ROS Other ros negative      Past Medical History:  Diagnosis Date  . Diabetes mellitus without complication (HCC)   . Kidney disease     Social History   Tobacco Use  . Smoking status: Former Games developer  . Smokeless tobacco: Never Used  Substance Use Topics  . Alcohol use: No  . Drug use: No    No family history on file.  Allergies  Allergen Reactions  . Piperacillin Hives  . Tazobactam Hives    OBJECTIVE: There were no vitals filed for this visit. There is no height or weight on file to calculate BMI.   Physical Exam Constitutional:      Appearance: Normal appearance. He is normal weight.  HENT:     Head: Normocephalic.     Mouth/Throat:     Mouth: Mucous membranes are moist.     Pharynx: Oropharynx is clear.  Eyes:     General: Scleral icterus present.     Conjunctiva/sclera: Conjunctivae normal.     Pupils: Pupils are equal, round, and reactive to light.  Cardiovascular:     Rate and Rhythm: Normal rate and regular rhythm.  Pulmonary:     Effort: Pulmonary effort is normal.     Breath sounds: Normal breath sounds.  Abdominal:     General: Abdomen is flat.     Palpations: Abdomen is soft.  Musculoskeletal:     Cervical back: Normal range of motion.     Comments: Left heel incision intact no dehiscence - sutures intact Left foot/LE swelling/edema 1+ mild redness along the incision  Skin:    Findings: No rash.     Comments: RUE picc site no  tenderness/purulence/redness  Neurological:     General: No focal deficit present.     Mental Status: He is oriented to person, place, and time.     Cranial Nerves: No cranial nerve deficit.  Psychiatric:        Mood and Affect: Mood normal.        Behavior: Behavior normal.     Lab:  Microbiology: 05/2020 heel cx mssa and proteus vulgaris (from [facility]) 11/2019 heel ulcer mixed skin flora, mssa (S tetra), proteus mirabilis (S ampicillin, levo)  Serology:  Imaging:   Assessment/plan: Problem List Items Addressed This Visit      Musculoskeletal and Integument   Acute osteomyelitis of left calcaneus (HCC) - Primary    Other Visit Diagnoses    Diabetic foot infection (HCC)         Abx: 05/28/20-c Ceftriaxone 05/28/20-c Metronidazole  Discussed natural history and pathogenesis of the diabetic foot infection, and especially poor prognosis of calcaneal ulcer/OM  Discuss high risk for requirement of bka  He is s/p recent I&D 05/28/2020 admission Previous cx before the I&D grew mssa and proteus  Now can try continue tx 6 weeks ceftriaxone/metronidazole. He appears to have controlled infection at this time  Will review his hospital record once available  07/03/20 id f/u assessment Unfortunately due to fragmented care (podiatry/[place] order including picc) he was stopped IV abx. He continues on metronidazole Given amp-c organism, and his difficulty to get IV abx/hh, he decided to go with oral. We discussed side effect of quinolone including tendon rupture and other vascular side effect   Continue f/u with podiatry Restart abx duration to today 3/29; start Will base abx duration 6 weeks from 06/03/20 until 07/16/2020 [facility] have [facility] fax labs to [place] cbc, cmp, crp Leg left persistent swelling, not worse, will get duplex (he missed the appointment last time)  Duplex u/s ordered for [place] imaging F/u 4 weeks Advise him to discuss with podiatry to get vascular  u/s Cbc/cmp, crp today levoflox and cefdinir Stop flagyl; allergic to piperacillin but took ceftriaxone ok    I spent 30 minute reviewing data/chart, and coordinating care and >50% direct face to face time providing counseling/discussing diagnostics/treatment plan with patient        Follow-up: Return in about 4 weeks (around 07/31/2020).  Raymondo Band, MD [facility] for Infectious Disease [facility] [phone] pager   [phone] cell 07/03/2020, 9:20 AM

## 2020-07-03 NOTE — Telephone Encounter (Signed)
Per MD called [facility] with verbal orders to draw labs and pull picc. Spoke with Jasmine December who was able to provide fax number and direct line in case office has any questions.  Orders signed and faxed.  P: [phone] F: [phone]

## 2020-07-03 NOTE — Telephone Encounter (Signed)
Pt's wife called wanting to know if hospital will call pt to restart IV antibiotics and when will that start.

## 2020-07-03 NOTE — Telephone Encounter (Signed)
Attempted to call [facility] radiology to schedule appointment for venous ultrasound. Left voicemail requesting call back to schedule appointment. Will update patient once scheduled.

## 2020-07-03 NOTE — Telephone Encounter (Signed)
ID called and LVM stating they need orders to do blood work, venous Ultrasound in order to remove picc line. Please advice

## 2020-07-03 NOTE — Patient Instructions (Signed)
Remove picc order written Stop metronidazole   Start levofloxacin 750 mg once a day, cefdinir 300 mg twice a day. These are antibiotics. Take for 6 weeks until 08/14/2020 (only stop if we discuss it). Avoid dairy products, calcium, iron, magnesium products within 4 hours of taking levofloxacin. Avoid rigorous activity while on levofloxacin. If you have muscle pain, let me know because that could be due to levofloxacin  Labs today  Follow up with me 4-6 weeks Follow up with podiatry as discussed with him  Duplex ultrasound for your left leg will be ordered. Our clinic will set it up for you (discuss at the front desk)

## 2020-07-04 ENCOUNTER — Telehealth: Payer: Self-pay

## 2020-07-04 MED ORDER — CEFPODOXIME PROXETIL 200 MG PO TABS: 400.0000 mg | ORAL_TABLET | Freq: Two times a day (BID) | ORAL | 1 refills | Status: AC

## 2020-07-04 NOTE — Telephone Encounter (Signed)
Patient's wife is calling in regards to the Cefdinir. She is requesting something cheaper for the patient.  She stated the Cefdinir is $30. [name] T Pricilla Loveless

## 2020-07-04 NOTE — Telephone Encounter (Signed)
[facility] returned phone call and I provided them with orders that are need for the patient CBC, CMP, CRP and pull picc orders. Fax number also provided for [facility] for order to be faxed. Nadalie Laughner T Pricilla Loveless

## 2020-07-04 NOTE — Telephone Encounter (Signed)
I spoke with patient's wife and she stated that patient will go ahead and take the Cefdinir.  She stated she would still call [facility] to see if the cefpdoxime is any cheaper and give our office a call if to let us know what the patient decides to do. Kierra Jezewski T Pricilla Loveless

## 2020-07-04 NOTE — Telephone Encounter (Signed)
FYI:  Appointment scheduled for Venous US tomorrow at 3 at [facility]. Patient will need to check in at outpatient center by 3.  Patient is okay with appointment time. Does not have any questions regarding this.  Will call office with any concerns. P: [phone] opt 7

## 2020-07-04 NOTE — Telephone Encounter (Signed)
Left message with triage nurse to provide correct fax number for patient.  Orders need to be sent to special procedures. F: [phone].

## 2020-07-04 NOTE — Addendum Note (Signed)
Addended byRutha Bouchard T on: 07/04/2020 03:18 PM   Modules accepted: Orders

## 2020-07-04 NOTE — Telephone Encounter (Signed)
Faxed patient's Rx order to remove picc line, and blood work to [employer] number F: [phone]

## 2020-07-05 ENCOUNTER — Telehealth: Payer: Self-pay

## 2020-07-05 NOTE — Telephone Encounter (Signed)
Orders were sent over yesterday

## 2020-07-05 NOTE — Telephone Encounter (Signed)
Orders sent yesterday

## 2020-07-05 NOTE — Telephone Encounter (Signed)
Received call from ultrasound department at [facility], requesting orders for imaging. RN spoke with Dr. Renold Don who relayed verbal orders for venous duplex ultrasound of the left leg. Signed orders faxed to [place] at [phone].  Sandie Ano, RN

## 2020-07-13 ENCOUNTER — Telehealth: Payer: Self-pay | Admitting: Internal Medicine

## 2020-07-13 NOTE — Telephone Encounter (Signed)
[facility] labs/imaging  3/31 duplex lle no dvt  3/18 cr 0.4; lft wnl; cbc 8/12/233

## 2020-07-16 ENCOUNTER — Other Ambulatory Visit: Payer: Self-pay

## 2020-07-16 ENCOUNTER — Ambulatory Visit (INDEPENDENT_AMBULATORY_CARE_PROVIDER_SITE_OTHER): Payer: Self-pay | Admitting: Podiatry

## 2020-07-16 DIAGNOSIS — L97423 Non-pressure chronic ulcer of left heel and midfoot with necrosis of muscle: Secondary | ICD-10-CM

## 2020-07-16 DIAGNOSIS — M86672 Other chronic osteomyelitis, left ankle and foot: Secondary | ICD-10-CM

## 2020-07-16 DIAGNOSIS — L97424 Non-pressure chronic ulcer of left heel and midfoot with necrosis of bone: Secondary | ICD-10-CM

## 2020-07-16 MED ORDER — SILVER SULFADIAZINE 1 % EX CREA
TOPICAL_CREAM | CUTANEOUS | 0 refills | Status: DC
Start: 2020-07-16 — End: 2020-10-20

## 2020-07-16 NOTE — Progress Notes (Signed)
  Subjective:  Patient ID: [name], male    DOB: [birth year],  MRN: [id number]  Chief Complaint  Patient presents with  . Routine Post Op    POV -pt deneis N/V/Fch -pt states," feeling good no pain." - less swelling -no rednes/drainage Tx: betadine WTD and dressing change   50 y.o. male presents for wound care. Hx confirmed with patient. Objective:  Physical Exam: Wound Location: left heel wound healing without active warmth. Chronic appearing posterior redness. No purulence expressible.  Skin edges mostly healed.  Small granular wound measuring approximately 1.5 x 1.5 at the superior margin of the wound without deep probing Assessment:   1. Heel ulcer, left, with necrosis of bone (HCC)   2. Other chronic osteomyelitis of left ankle (HCC)   3. Heel ulcer, left, with necrosis of muscle (HCC)    Plan:  Patient was evaluated and treated and all questions answered.  Ulcer Left heel -Continue IV abx per ID -Majority of sutures and staples removed with the exception of the more plantar based months. -Dressed with Betadine wet-to-dry today -He does have chronic edema to the area without active redness or signs of acute infection.  Hopefully the antibiotics can resolve the infection; otherwise patient is at a high risk of below-knee amputation -There was a granular area of the wound today which was cauterized with silver nitrate  Return in about 2 weeks (around 07/30/2020) for Wound Care.

## 2020-07-26 DIAGNOSIS — M79676 Pain in unspecified toe(s): Secondary | ICD-10-CM

## 2020-07-30 ENCOUNTER — Other Ambulatory Visit: Payer: Self-pay

## 2020-07-30 ENCOUNTER — Ambulatory Visit (INDEPENDENT_AMBULATORY_CARE_PROVIDER_SITE_OTHER): Payer: Self-pay

## 2020-07-30 ENCOUNTER — Other Ambulatory Visit: Payer: Self-pay | Admitting: Podiatry

## 2020-07-30 ENCOUNTER — Ambulatory Visit (INDEPENDENT_AMBULATORY_CARE_PROVIDER_SITE_OTHER): Payer: Medicaid Other | Admitting: Podiatry

## 2020-07-30 ENCOUNTER — Telehealth: Payer: Self-pay | Admitting: Podiatry

## 2020-07-30 DIAGNOSIS — L97424 Non-pressure chronic ulcer of left heel and midfoot with necrosis of bone: Secondary | ICD-10-CM

## 2020-07-30 DIAGNOSIS — L97423 Non-pressure chronic ulcer of left heel and midfoot with necrosis of muscle: Secondary | ICD-10-CM

## 2020-07-30 DIAGNOSIS — M86672 Other chronic osteomyelitis, left ankle and foot: Secondary | ICD-10-CM

## 2020-07-30 NOTE — Telephone Encounter (Signed)
Notified pt re: ordering boot on [employer]/pt understood

## 2020-07-30 NOTE — Telephone Encounter (Signed)
If they can't afford our [name] they can get one on [employer]. 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 Please let him know

## 2020-07-30 NOTE — Telephone Encounter (Signed)
Pt would like to know if there is anything in office that can substitute for the boot you sent him to [employer] for.  They can't afford anything and the one recommended is expensive per wife

## 2020-07-30 NOTE — Progress Notes (Signed)
  Subjective:  Patient ID: [name], male    DOB: [birth year],  MRN: [id number]  Chief Complaint  Patient presents with  . Routine Post Op    POV -pt denies NV/F/Ch -pt states," doing fine, healing well, no issues." -pt denies redness/swelling/drainage from incision area Tx; abx, betadine and dressing    50 y.o. male presents for wound care. Hx confirmed with patient. Objective:  Physical Exam: Wound Location: left heel wound healing without active warmth. No redness.  Skin edges healed. No open wound. Assessment:   1. Heel ulcer, left, with necrosis of muscle (HCC)   2. Heel ulcer, left, with necrosis of bone (HCC)    Plan:  Patient was evaluated and treated and all questions answered.  Ulcer Left heel -New XR show significant bone erosion since prior. Unclear timing. No signs of active infection today hopefully the infection is resolved but could likely be quiescent.  -Wound fully healed. Sutures and small remaining staple removed. -Start WB in CAM boot. Rx written for boot from [employer] -Povidone and band-aids to toes where patient cut his toenails too short  Return in about 4 weeks (around 08/27/2020) for f/u osteomyelitis.

## 2020-08-08 ENCOUNTER — Telehealth: Payer: Self-pay

## 2020-08-08 NOTE — Telephone Encounter (Signed)
Patient's wife is calling asking if patient should continue oral abx until his follow up with your on 08/13/20. Patient's wife states patient finished the abx yesterday and does not have any refills.\ Please advise if you want me to send in refills. Zoriah Pulice T Pricilla Loveless

## 2020-08-08 NOTE — Telephone Encounter (Signed)
Patients wife informed and verbalized understanding

## 2020-08-13 ENCOUNTER — Telehealth: Payer: Self-pay

## 2020-08-13 ENCOUNTER — Ambulatory Visit: Payer: Medicaid Other | Admitting: Internal Medicine

## 2020-08-13 NOTE — Telephone Encounter (Signed)
Patient's wife states patient had lab work done at [facility] within the last two weeks. Dr. Renold Don requesting these records. RN faxed request to [facility] for most recent lab work. Patient's wife states she will speak with patient and call back tomorrow morning to schedule virtual appointment.   Sandie Ano, RN

## 2020-08-13 NOTE — Telephone Encounter (Signed)
Received call from patient's wife, they need to cancel today's appointment. She is asking about patient's labs, RN advised herr that labs and plan of care were to be discussed at today's appointment. She is wondering if next appointment can be virtual. Will route to provider. Sandie Ano, RN

## 2020-08-14 ENCOUNTER — Encounter: Payer: Self-pay | Admitting: Podiatry

## 2020-08-14 ENCOUNTER — Telehealth: Payer: Self-pay

## 2020-08-14 ENCOUNTER — Telehealth: Payer: Self-pay | Admitting: Podiatry

## 2020-08-14 NOTE — Telephone Encounter (Signed)
I would just keep him out until we can see him next week and decide

## 2020-08-14 NOTE — Telephone Encounter (Signed)
Labs received, placed in provider's box for review.   Sandie Ano, RN

## 2020-08-14 NOTE — Telephone Encounter (Signed)
Per wife, Pt's employer is requesting note for when pt will be able to rtw without restrictions.  Currently you have him written out thru 08-18-20.  Please advise.

## 2020-08-14 NOTE — Telephone Encounter (Signed)
Called patient to get a virtual appointment scheduled with Dr. Renold Don sometime this week, wife said she will have patient call us back to get that scheduled

## 2020-08-15 NOTE — Telephone Encounter (Signed)
Spoke to patient's wife, she states the patient will not be able to make an appointment this week. Advised her to please call and make an appointment for next week as soon as the patient knows his schedule.   Sandie Ano, RN

## 2020-08-24 ENCOUNTER — Telehealth: Payer: Self-pay | Admitting: Podiatry

## 2020-08-24 MED ORDER — DOXYCYCLINE HYCLATE 100 MG PO TABS: 100.0000 mg | ORAL_TABLET | Freq: Two times a day (BID) | ORAL | 0 refills | Status: DC

## 2020-08-24 MED ORDER — SULFAMETHOXAZOLE-TRIMETHOPRIM 800-160 MG PO TABS: 1.0000 | ORAL_TABLET | Freq: Two times a day (BID) | ORAL | 0 refills | Status: DC

## 2020-08-24 NOTE — Addendum Note (Signed)
Addended by: Ventura Sellers on: 08/24/2020 11:33 AM   Modules accepted: Orders

## 2020-08-24 NOTE — Addendum Note (Signed)
Addended by: Ventura Sellers on: 08/24/2020 11:34 AM   Modules accepted: Orders

## 2020-08-24 NOTE — Telephone Encounter (Signed)
I'll wait for picture but if it's not streaking or painful and he does not have N/V/F/Ch I can probably send him some antibiotics and see him on Monday

## 2020-08-24 NOTE — Telephone Encounter (Signed)
Pt's wife called stating pt has swelling, redness on toes with white area with hole/indention in it.  Not painful, no red streaks.  Would like to know if they need to go to ED or if he needs antibiotics or to see you.  Wife is trying to email picture for reference-will forward to you once recvd.

## 2020-08-27 ENCOUNTER — Ambulatory Visit: Payer: Self-pay | Admitting: Podiatry

## 2020-08-27 ENCOUNTER — Other Ambulatory Visit: Payer: Self-pay | Admitting: Podiatry

## 2020-08-27 ENCOUNTER — Ambulatory Visit (INDEPENDENT_AMBULATORY_CARE_PROVIDER_SITE_OTHER): Payer: Self-pay | Admitting: Podiatry

## 2020-08-27 ENCOUNTER — Encounter: Payer: Self-pay | Admitting: Podiatry

## 2020-08-27 ENCOUNTER — Other Ambulatory Visit: Payer: Self-pay

## 2020-08-27 ENCOUNTER — Ambulatory Visit (INDEPENDENT_AMBULATORY_CARE_PROVIDER_SITE_OTHER): Payer: Self-pay

## 2020-08-27 VITALS — BP 141/90 | HR 93 | Temp 97.7°F | Resp 16

## 2020-08-27 DIAGNOSIS — L97529 Non-pressure chronic ulcer of other part of left foot with unspecified severity: Secondary | ICD-10-CM

## 2020-08-27 DIAGNOSIS — L97521 Non-pressure chronic ulcer of other part of left foot limited to breakdown of skin: Secondary | ICD-10-CM

## 2020-08-27 DIAGNOSIS — L03032 Cellulitis of left toe: Secondary | ICD-10-CM

## 2020-08-27 DIAGNOSIS — L02612 Cutaneous abscess of left foot: Secondary | ICD-10-CM

## 2020-08-27 DIAGNOSIS — M86672 Other chronic osteomyelitis, left ankle and foot: Secondary | ICD-10-CM

## 2020-08-27 NOTE — Progress Notes (Signed)
  Subjective:  Patient ID: [name], male    DOB: Mar 10, 1971,  MRN: [id number]  Chief Complaint  Patient presents with  . Routine Post Op    POV -pt states sx site is healed, no issues with sx site -pt denies N/v/F/CH tx: none  . Ulcer    New open lesion at Lt 3rd toe -x Friday -w. Redness, swelling and open at joint no injury/pain tx: betadine, silvadene cream and abx   50 y.o. male presents for wound care. Hx confirmed with patient. Objective:  Physical Exam: Wound Location: left heel wound healed. No active warmth. No redness. No pain to palpation. Good ankle ROM noted. Left 3rd toe ulcer measuring 2x1.5 with fibrotic base, milld purulence, erythema extending to the base of the toe.  Assessment:   1. Ulcer of left foot, limited to breakdown of skin (HCC)   2. Other chronic osteomyelitis of left ankle (HCC)   3. Skin ulcer of left great toe, unspecified ulcer stage (HCC)   4. Cellulitis and abscess of toe of left foot    Plan:  Patient was evaluated and treated and all questions answered.  Left heel osteomyelitis -Appears healed, infection quiescent, will monitor for signs of recurrence. At this point patient can continue to WB on the heel. He may need a wider shoe to accommodate for the heel.  Left 3rd toe ulceration with infection -Failed PO abx, appears worsened -XR without definite bone erosion today. -Will proceed to ED to start IV abx. Will need MRI at that time to r/o bone infection. High suspicion for OM. At minimum may need debridement and irrigation, may need amputation of the digit. -Dressed with betadine WTD today.  No follow-ups on file.

## 2020-08-28 DIAGNOSIS — L03116 Cellulitis of left lower limb: Secondary | ICD-10-CM

## 2020-08-28 DIAGNOSIS — M86172 Other acute osteomyelitis, left ankle and foot: Secondary | ICD-10-CM

## 2020-08-28 DIAGNOSIS — E1165 Type 2 diabetes mellitus with hyperglycemia: Secondary | ICD-10-CM

## 2020-09-04 ENCOUNTER — Telehealth: Payer: Self-pay | Admitting: Podiatry

## 2020-09-04 NOTE — Telephone Encounter (Signed)
Pt called requesting a note again to be cleared to return to work light duty. 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 States ulcer is doing well also Currently pt is written out of work until 09-30-20.  Pls advise.

## 2020-09-04 NOTE — Telephone Encounter (Signed)
I would not recommend he go back at this time. I'd prefer he stay out at least another week.  Also please make sure he has f/u appt for his surgery on his toe.

## 2020-09-10 ENCOUNTER — Other Ambulatory Visit: Payer: Self-pay

## 2020-09-10 ENCOUNTER — Ambulatory Visit (INDEPENDENT_AMBULATORY_CARE_PROVIDER_SITE_OTHER): Payer: Medicaid Other | Admitting: Podiatry

## 2020-09-10 ENCOUNTER — Ambulatory Visit (INDEPENDENT_AMBULATORY_CARE_PROVIDER_SITE_OTHER): Payer: Self-pay

## 2020-09-10 DIAGNOSIS — L97521 Non-pressure chronic ulcer of other part of left foot limited to breakdown of skin: Secondary | ICD-10-CM

## 2020-09-10 DIAGNOSIS — M86672 Other chronic osteomyelitis, left ankle and foot: Secondary | ICD-10-CM

## 2020-09-10 MED ORDER — SULFAMETHOXAZOLE-TRIMETHOPRIM 800-160 MG PO TABS: 1.0000 | ORAL_TABLET | Freq: Two times a day (BID) | ORAL | 0 refills | Status: DC

## 2020-09-17 ENCOUNTER — Ambulatory Visit: Payer: Medicaid Other | Admitting: Podiatry

## 2020-09-20 ENCOUNTER — Ambulatory Visit: Payer: Medicaid Other | Admitting: Podiatry

## 2020-09-23 NOTE — Progress Notes (Signed)
  Subjective:  Patient ID: [name], male    DOB: [birth year],  MRN: [id number]  No chief complaint on file.  50 y.o. male presents for wound care. Hx confirmed with patient.  States that the heel is doing well and surgical site is doing okay but now has a new sore to the left second toe for 2 days.  Has been taking Levaquin and it has been doing better.  Reports redness and sore in the area Objective:  Physical Exam: Wound Location: left heel wound healed. No active warmth. No redness. No pain to palpation. Good ankle ROM noted.  Left third toe amputation site healing well with no warmth no erythema signs of acute infection.  New ulceration to the dorsal aspect of second PIPJ without probe to bone but visible tendon.  Mild surrounding erythema no ascending cellulitis Assessment:   1. Ulcer of left foot, limited to breakdown of skin (HCC)   2. Other chronic osteomyelitis of left ankle (HCC)    Plan:  Patient was evaluated and treated and all questions answered.  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  I think him going back to work and wearing his normal shoe gear has caused this second total ulcerated.  I am worried about this toe worsening and eventually requiring amputation.  X-rays today show no signs of bone erosion.  Dressed with antibiotic and Band-Aid, will refill Bactrim follow-up in 1 week  Return in about 1 week (around 09/17/2020) for Wound Care.

## 2020-09-25 ENCOUNTER — Ambulatory Visit: Payer: Self-pay | Admitting: Allergy

## 2020-09-27 ENCOUNTER — Ambulatory Visit: Payer: Medicaid Other | Admitting: Podiatry

## 2020-10-04 ENCOUNTER — Ambulatory Visit: Payer: Medicaid Other | Admitting: Podiatry

## 2020-10-18 ENCOUNTER — Ambulatory Visit (INDEPENDENT_AMBULATORY_CARE_PROVIDER_SITE_OTHER): Payer: Self-pay | Admitting: Podiatry

## 2020-10-18 ENCOUNTER — Other Ambulatory Visit: Payer: Self-pay

## 2020-10-18 ENCOUNTER — Encounter: Payer: Self-pay | Admitting: Podiatry

## 2020-10-18 ENCOUNTER — Ambulatory Visit (INDEPENDENT_AMBULATORY_CARE_PROVIDER_SITE_OTHER): Payer: Medicaid Other

## 2020-10-18 DIAGNOSIS — M86672 Other chronic osteomyelitis, left ankle and foot: Secondary | ICD-10-CM

## 2020-10-18 DIAGNOSIS — L03032 Cellulitis of left toe: Secondary | ICD-10-CM

## 2020-10-18 DIAGNOSIS — L02612 Cutaneous abscess of left foot: Secondary | ICD-10-CM

## 2020-10-18 DIAGNOSIS — L97521 Non-pressure chronic ulcer of other part of left foot limited to breakdown of skin: Secondary | ICD-10-CM

## 2020-10-18 MED ORDER — DOXYCYCLINE HYCLATE 100 MG PO TABS: 100.0000 mg | ORAL_TABLET | Freq: Two times a day (BID) | ORAL | 0 refills | Status: DC

## 2020-10-19 NOTE — Progress Notes (Signed)
  Subjective:  Patient ID: [name], male    DOB: 05-Oct-1970,  MRN: [id number]  Chief Complaint  Patient presents with   Routine Post Op    I am doing ok and I think that the staples come out today    50 y.o. male presents for wound care. Hx confirmed with patient.  Has not been wearing his surgical shoe as directed and he has been wearing his boot. Denies new concerns. Objective:  Physical Exam: Wound Location: left heel wound healed. No active warmth. No redness. No pain to palpation. Good ankle ROM noted.  Left third toe amputation site healing well with no warmth no erythema signs of acute infection.  Worsened ulceration to the dorsal aspect of second PIPJ without probe to bone but visible tendon.  Moderate surrounding erythema no ascending cellulitis.  Radiographs: XR taken show osteolysis of the 2nd toe proximal and middle phalanges consistent with osteomyelitis. Assessment:   1. Ulcer of left foot, limited to breakdown of skin (HCC)   2. Other chronic osteomyelitis of left ankle (HCC)   3. Cellulitis and abscess of toe of left foot    Plan:  Patient was evaluated and treated and all questions answered.  Left heel osteomyelitis -Appears healed without recurrence of infection  Left 3rd toe status post amputation -Healing well  Left second toe ulceration -Worsened, now with osteomyelitis. At present he does not have systemic signs of infection nor ascending cellulitis so we can start supressive abx until we can proceed with amputation. Discussed importance of compliance to prevent further worsening ulcerations. Dressed with betadine WTD. -Patient has failed all conservative therapy and wishes to proceed with surgical intervention. All risks, benefits, and alternatives discussed with patient. No guarantees given. Consent reviewed and signed by patient. -Planned procedures: left 2nd toe amputation -ASA 3 - Patient with moderate systemic disease with functional limitations     No follow-ups on file.

## 2020-10-20 ENCOUNTER — Ambulatory Visit: Payer: Self-pay | Admitting: Podiatry

## 2020-10-20 ENCOUNTER — Emergency Department (HOSPITAL_COMMUNITY): Payer: Medicaid Other

## 2020-10-20 ENCOUNTER — Observation Stay (HOSPITAL_COMMUNITY)
Admission: EM | Admit: 2020-10-20 | Discharge: 2020-10-21 | Disposition: A | Discharge status: Home / Self Care | Payer: Medicaid Other | Attending: Internal Medicine | Admitting: Internal Medicine

## 2020-10-20 ENCOUNTER — Other Ambulatory Visit: Payer: Self-pay

## 2020-10-20 ENCOUNTER — Encounter (HOSPITAL_COMMUNITY): Payer: Self-pay

## 2020-10-20 DIAGNOSIS — Z79899 Other long term (current) drug therapy: Secondary | ICD-10-CM | POA: Diagnosis not present

## 2020-10-20 DIAGNOSIS — M869 Osteomyelitis, unspecified: Secondary | ICD-10-CM | POA: Diagnosis present

## 2020-10-20 DIAGNOSIS — E119 Type 2 diabetes mellitus without complications: Secondary | ICD-10-CM | POA: Insufficient documentation

## 2020-10-20 DIAGNOSIS — Z87891 Personal history of nicotine dependence: Secondary | ICD-10-CM | POA: Diagnosis not present

## 2020-10-20 DIAGNOSIS — M86172 Other acute osteomyelitis, left ankle and foot: Secondary | ICD-10-CM | POA: Diagnosis present

## 2020-10-20 DIAGNOSIS — D510 Vitamin B12 deficiency anemia due to intrinsic factor deficiency: Secondary | ICD-10-CM | POA: Diagnosis present

## 2020-10-20 DIAGNOSIS — Z20822 Contact with and (suspected) exposure to covid-19: Secondary | ICD-10-CM | POA: Insufficient documentation

## 2020-10-20 DIAGNOSIS — Z794 Long term (current) use of insulin: Secondary | ICD-10-CM | POA: Insufficient documentation

## 2020-10-20 DIAGNOSIS — I10 Essential (primary) hypertension: Secondary | ICD-10-CM | POA: Insufficient documentation

## 2020-10-20 DIAGNOSIS — IMO0002 Reserved for concepts with insufficient information to code with codable children: Secondary | ICD-10-CM | POA: Diagnosis present

## 2020-10-20 DIAGNOSIS — Z9889 Other specified postprocedural states: Secondary | ICD-10-CM

## 2020-10-20 DIAGNOSIS — L089 Local infection of the skin and subcutaneous tissue, unspecified: Secondary | ICD-10-CM

## 2020-10-20 DIAGNOSIS — E1165 Type 2 diabetes mellitus with hyperglycemia: Secondary | ICD-10-CM

## 2020-10-20 LAB — BASIC METABOLIC PANEL
Anion gap: 9 (ref 5–15)
BUN: 16 mg/dL (ref 6–20)
CO2: 26 mmol/L (ref 22–32)
Calcium: 8.8 mg/dL — ABNORMAL LOW (ref 8.9–10.3)
Chloride: 102 mmol/L (ref 98–111)
Creatinine, Ser: 0.59 mg/dL — ABNORMAL LOW (ref 0.61–1.24)
GFR, Estimated: >60 mL/min (ref >60)
Glucose, Bld: 315 mg/dL — ABNORMAL HIGH (ref 70–99)
Potassium: 4.2 mmol/L (ref 3.5–5.1)
Sodium: 137 mmol/L (ref 135–145)

## 2020-10-20 LAB — CBC WITH DIFFERENTIAL/PLATELET
Abs Immature Granulocytes: 0.02 10*3/uL (ref 0.00–0.07)
Basophils Absolute: 0 10*3/uL (ref 0.0–0.1)
Basophils Relative: 1 %
Eosinophils Absolute: 0.3 10*3/uL (ref 0.0–0.5)
Eosinophils Relative: 6 %
HCT: 38 % — ABNORMAL LOW (ref 39.0–52.0)
Hemoglobin: 12.8 g/dL — ABNORMAL LOW (ref 13.0–17.0)
Immature Granulocytes: 0 %
Lymphocytes Relative: 29 %
Lymphs Abs: 1.4 10*3/uL (ref 0.7–4.0)
MCH: 30.6 pg (ref 26.0–34.0)
MCHC: 33.7 g/dL (ref 30.0–36.0)
MCV: 90.9 fL (ref 80.0–100.0)
Monocytes Absolute: 0.4 10*3/uL (ref 0.1–1.0)
Monocytes Relative: 9 %
Neutro Abs: 2.7 10*3/uL (ref 1.7–7.7)
Neutrophils Relative %: 55 %
Platelets: 244 10*3/uL (ref 150–400)
RBC: 4.18 MIL/uL — ABNORMAL LOW (ref 4.22–5.81)
RDW: 13.7 % (ref 11.5–15.5)
WBC: 4.8 10*3/uL (ref 4.0–10.5)
nRBC: 0 % (ref 0.0–0.2)

## 2020-10-20 LAB — CBG MONITORING, ED: Glucose-Capillary: 264 mg/dL — ABNORMAL HIGH (ref 70–99)

## 2020-10-20 LAB — MRSA NEXT GEN BY PCR, NASAL: MRSA by PCR Next Gen: NOT DETECTED

## 2020-10-20 LAB — RESP PANEL BY RT-PCR (FLU A&B, COVID) ARPGX2
Influenza A by PCR: NEGATIVE
Influenza B by PCR: NEGATIVE
SARS Coronavirus 2 by RT PCR: NEGATIVE

## 2020-10-20 LAB — GLUCOSE, CAPILLARY: Glucose-Capillary: 263 mg/dL — ABNORMAL HIGH (ref 70–99)

## 2020-10-20 LAB — LACTIC ACID, PLASMA
Lactic Acid, Venous: 2.5 mmol/L (ref 0.5–1.9)
Lactic Acid, Venous: 3.2 mmol/L (ref 0.5–1.9)

## 2020-10-20 MED ORDER — SODIUM CHLORIDE 0.9 % IV SOLN
2.0000 g | Freq: Three times a day (TID) | INTRAVENOUS | Status: DC
  Administered 2020-10-21 (×2): 2 g via INTRAVENOUS
  Filled 2020-10-20 (×3): qty 2

## 2020-10-20 MED ORDER — INSULIN DETEMIR 100 UNIT/ML FLEXPEN: 25.0000 [IU] | PEN_INJECTOR | Freq: Every day | SUBCUTANEOUS | Status: DC

## 2020-10-20 MED ORDER — LACTATED RINGERS IV BOLUS
1000.0000 mL | Freq: Once | INTRAVENOUS | Status: AC
  Administered 2020-10-20: 1000 mL via INTRAVENOUS

## 2020-10-20 MED ORDER — SODIUM CHLORIDE 0.9 % IV SOLN
2.0000 g | Freq: Once | INTRAVENOUS | Status: AC
  Administered 2020-10-20: 2 g via INTRAVENOUS
  Filled 2020-10-20: qty 2

## 2020-10-20 MED ORDER — LACTATED RINGERS IV SOLN: INTRAVENOUS | Status: DC

## 2020-10-20 MED ORDER — INSULIN ASPART 100 UNIT/ML IJ SOLN
0.0000 [IU] | INTRAMUSCULAR | Status: DC
  Administered 2020-10-20 (×2): 5 [IU] via SUBCUTANEOUS
  Administered 2020-10-21: 3 [IU] via SUBCUTANEOUS
  Filled 2020-10-20: qty 0.09

## 2020-10-20 MED ORDER — MORPHINE SULFATE (PF) 2 MG/ML IV SOLN
1.0000 mg | INTRAVENOUS | Status: DC | PRN
Start: 2020-10-20 — End: 2020-10-21

## 2020-10-20 MED ORDER — VANCOMYCIN HCL 1750 MG/350ML IV SOLN
1750.0000 mg | Freq: Once | INTRAVENOUS | Status: DC
  Filled 2020-10-20 (×2): qty 350

## 2020-10-20 MED ORDER — VANCOMYCIN HCL 1500 MG/300ML IV SOLN
1500.0000 mg | Freq: Two times a day (BID) | INTRAVENOUS | Status: DC
  Filled 2020-10-20: qty 300

## 2020-10-20 MED ORDER — ACETAMINOPHEN 325 MG PO TABS: 650.0000 mg | ORAL_TABLET | Freq: Four times a day (QID) | ORAL | Status: DC | PRN

## 2020-10-20 MED ORDER — ACETAMINOPHEN 650 MG RE SUPP: 650.0000 mg | Freq: Four times a day (QID) | RECTAL | Status: DC | PRN

## 2020-10-20 MED ORDER — INSULIN DETEMIR 100 UNIT/ML ~~LOC~~ SOLN
25.0000 [IU] | Freq: Every day | SUBCUTANEOUS | Status: DC
  Administered 2020-10-20: 25 [IU] via SUBCUTANEOUS
  Filled 2020-10-20 (×2): qty 0.25

## 2020-10-20 NOTE — H&P (Signed)
History and Physical    [name] OAC:[id number] DOB: January 10, 1971 DOA: 10/20/2020  PCP: [place], Sarah T, MD  Patient coming from: Home.  Chief Complaint: Left foot second toe swelling.  HPI: [name] is a 50 y.o. male with history of diabetes mellitus type 2, hypertension, pernicious anemia who has had a recent amputation of the left foot third toe for osteomyelitis has been noticing increasing swelling of the left second toe and has been following up with podiatrist Dr. Samuella Cota.  Work-up showed possible osteomyelitis of the left foot second toe and was advised to come to the ER.  Patient was on antibiotics.  ED Course: In the ER patient is afebrile blood sugar was 315 lactic acid 2.5 hemoglobin 12.8 x-rays to confirm the osteomyelitis.  Patient is kept n.p.o. in anticipation of possible surgery.  Started on empiric antibiotics.  COVID test was negative.  Review of Systems: As per HPI, rest all negative.   Past Medical History:  Diagnosis Date   Diabetes mellitus without complication (HCC)    Kidney disease     Past Surgical History:  Procedure Laterality Date   mastoid surgery       reports that he has quit smoking. He has never used smokeless tobacco. He reports that he does not drink alcohol and does not use drugs.  Allergies  Allergen Reactions   Piperacillin Hives   Tazobactam Hives    Family History  Problem Relation Age of Onset   Diabetes Mellitus II Sister     Prior to Admission medications   Medication Sig Start Date End Date Taking? Authorizing Provider  calcium carbonate (OSCAL) 1500 (600 Ca) MG TABS tablet Take by mouth 2 (two) times daily with a meal.    [provider]  cyanocobalamin (,VITAMIN B-12,) 1000 MCG/ML injection 1,000 mcg. 05/16/20   [provider]  diclofenac (VOLTAREN) 50 MG EC tablet Take 50 mg by mouth 3 (three) times daily.    [provider]  doxycycline (VIBRA-TABS) 100 MG tablet Take 1 tablet (100 mg total)  by mouth 2 (two) times daily. 10/18/20   Park Liter, DPM  famotidine (PEPCID) 20 MG tablet Take 20 mg by mouth 2 (two) times daily. 08/07/20   [provider]  ferrous sulfate 324 MG TBEC 324 mg. 05/16/20   [provider]  HUMALOG KWIKPEN 100 UNIT/ML KwikPen INJECT 12 UNITS UNDER THE SKIN 3 TIMES DAILY WITH MEALS 07/11/20   [provider]  insulin aspart (NOVOLOG) 100 UNIT/ML injection Inject 12 Units into the skin. 2 times daily 05/09/19   [provider]  insulin detemir (LEVEMIR) 100 UNIT/ML FlexPen Inject into the skin. 32 units nightly 05/09/19   [provider]  Insulin Glargine (BASAGLAR KWIKPEN) 100 UNIT/ML INJECT 32 UNITS UNDER THE SKIN NIGHTLY (START ONLY AFTER RUNNING OUT OF LEVEMIR) 07/11/20   [provider]  levofloxacin (LEVAQUIN) 750 MG tablet Take 750 mg by mouth daily. 08/30/20   [provider]  loratadine (CLARITIN) 10 MG tablet Take 10 mg by mouth daily. 06/23/20   [provider]  losartan (COZAAR) 25 MG tablet Take 25 mg by mouth daily. 06/17/20   [provider]  Magnesium 250 MG TABS Take by mouth.    [provider]  ondansetron (ZOFRAN-ODT) 4 MG disintegrating tablet 4 mg. 05/16/20   [provider]  oxyCODONE (OXY IR/ROXICODONE) 5 MG immediate release tablet 5 MG PO TID As Needed for Moderate To Severe Pain 05/16/20   [provider]  predniSONE (DELTASONE) 20 MG tablet Take 20 mg by mouth daily. 08/07/20   [provider]  sildenafil (REVATIO) 20 MG tablet Take by mouth. 10/09/20   [provider]  silver sulfADIAZINE (SILVADENE) 1 % cream Apply pea-sized amount to wound daily. 07/16/20   Park Liter, DPM  sulfamethoxazole-trimethoprim (BACTRIM DS) 800-160 MG tablet Take 1 tablet by mouth 2 (two) times daily. 09/10/20   Park Liter, DPM    Physical Exam: Constitutional: Moderately built and nourished. Vitals:   10/20/20 1326  BP: 136/87  Pulse: 79   Resp: 16  Temp: 97.8 F (36.6 C)  TempSrc: Oral  SpO2: 99%   Eyes: Anicteric no pallor. ENMT: No discharge from the ears eyes nose and mouth. Neck: No mass felt.  No neck rigidity. Respiratory: No rhonchi or crepitations. Cardiovascular: S1-S2 heard. Abdomen: Soft nontender bowel sounds present. Musculoskeletal: Swelling and redness of the left foot second toe. Skin: Swelling and redness of the left foot second toe there is also staples on the amputated area of the third toe. Neurologic: Alert awake oriented to time place and person.  Moves all extremities. Psychiatric: Appears normal.  Normal affect.   Labs on Admission: I have personally reviewed following labs and imaging studies  CBC: Recent Labs  Lab 10/20/20 1431  WBC 4.8  NEUTROABS 2.7  HGB 12.8*  HCT 38.0*  MCV 90.9  PLT 244   Basic Metabolic Panel: Recent Labs  Lab 10/20/20 1431  NA 137  K 4.2  CL 102  CO2 26  GLUCOSE 315*  BUN 16  CREATININE 0.59*  CALCIUM 8.8*   GFR: CrCl cannot be calculated (Unknown ideal weight.). Liver Function Tests: No results for input(s): AST, ALT, ALKPHOS, BILITOT, PROT, ALBUMIN in the last 168 hours. No results for input(s): LIPASE, AMYLASE in the last 168 hours. No results for input(s): AMMONIA in the last 168 hours. Coagulation Profile: No results for input(s): INR, PROTIME in the last 168 hours. Cardiac Enzymes: No results for input(s): CKTOTAL, CKMB, CKMBINDEX, TROPONINI in the last 168 hours. BNP (last 3 results) No results for input(s): PROBNP in the last 8760 hours. HbA1C: No results for input(s): HGBA1C in the last 72 hours. CBG: Recent Labs  Lab 10/20/20 1524  GLUCAP 264*   Lipid Profile: No results for input(s): CHOL, HDL, LDLCALC, TRIG, CHOLHDL, LDLDIRECT in the last 72 hours. Thyroid Function Tests: No results for input(s): TSH, T4TOTAL, FREET4, T3FREE, THYROIDAB in the last 72 hours. Anemia Panel: No results for input(s): VITAMINB12, FOLATE,  FERRITIN, TIBC, IRON, RETICCTPCT in the last 72 hours. Urine analysis:    Component Value Date/Time   COLORURINE Straw 11/05/2013 0438   COLORURINE YELLOW 08/04/2013 2019   APPEARANCEUR Clear 11/05/2013 0438   LABSPEC 1.029 11/05/2013 0438   PHURINE 5.0 11/05/2013 0438   PHURINE 6.0 08/04/2013 2019   GLUCOSEU >=500 11/05/2013 0438   HGBUR Negative 11/05/2013 0438   HGBUR NEGATIVE 08/04/2013 2019   BILIRUBINUR Negative 11/05/2013 0438   KETONESUR 1+ 11/05/2013 0438   KETONESUR 15 (A) 08/04/2013 2019   PROTEINUR Negative 11/05/2013 0438   PROTEINUR NEGATIVE 08/04/2013 2019   UROBILINOGEN 1.0 08/04/2013 2019   NITRITE Negative 11/05/2013 0438   NITRITE NEGATIVE 08/04/2013 2019   LEUKOCYTESUR Negative 11/05/2013 0438   Sepsis Labs: @LABRCNTIP (procalcitonin:4,lacticidven:4) ) Recent Results (from the past 240 hour(s))  Resp Panel by RT-PCR (Flu A&B, Covid) Nasopharyngeal Swab     Status: None   Collection Time: 10/20/20  2:31 PM  Specimen: Nasopharyngeal Swab; Nasopharyngeal(NP) swabs in vial transport medium  Result Value Ref Range Status   SARS Coronavirus 2 by RT PCR NEGATIVE NEGATIVE Final    Comment: (NOTE) SARS-CoV-2 target nucleic acids are NOT DETECTED.  The SARS-CoV-2 RNA is generally detectable in upper respiratory specimens during the acute phase of infection. The lowest concentration of SARS-CoV-2 viral copies this assay can detect is 138 copies/mL. A negative result does not preclude SARS-Cov-2 infection and should not be used as the sole basis for treatment or other patient management decisions. A negative result may occur with  improper specimen collection/handling, submission of specimen other than nasopharyngeal swab, presence of viral mutation(s) within the areas targeted by this assay, and inadequate number of viral copies(<138 copies/mL). A negative result must be combined with clinical observations, patient history, and epidemiological information. The  expected result is Negative.  Fact Sheet for Patients:  BloggerCourse.com  Fact Sheet for Healthcare Providers:  SeriousBroker.it  This test is no t yet approved or cleared by the [place] FDA and  has been authorized for detection and/or diagnosis of SARS-CoV-2 by FDA under an Emergency Use Authorization (EUA). This EUA will remain  in effect (meaning this test can be used) for the duration of the COVID-19 declaration under Section 564(b)(1) of the Act, 21 U.S.C.section 360bbb-3(b)(1), unless the authorization is terminated  or revoked sooner.       Influenza A by PCR NEGATIVE NEGATIVE Final   Influenza B by PCR NEGATIVE NEGATIVE Final    Comment: (NOTE) The Xpert Xpress SARS-CoV-2/FLU/RSV plus assay is intended as an aid in the diagnosis of influenza from Nasopharyngeal swab specimens and should not be used as a sole basis for treatment. Nasal washings and aspirates are unacceptable for Xpert Xpress SARS-CoV-2/FLU/RSV testing.  Fact Sheet for Patients: BloggerCourse.com  Fact Sheet for Healthcare Providers: SeriousBroker.it  This test is not yet approved or cleared by the [place] FDA and has been authorized for detection and/or diagnosis of SARS-CoV-2 by FDA under an Emergency Use Authorization (EUA). This EUA will remain in effect (meaning this test can be used) for the duration of the COVID-19 declaration under Section 564(b)(1) of the Act, 21 U.S.C. section 360bbb-3(b)(1), unless the authorization is terminated or revoked.  Performed at [facility], [address], [place], [place] [address]      Radiological Exams on Admission: DG Foot Complete Left  Result Date: 10/20/2020 CLINICAL DATA:  Concern for osteomyelitis. EXAM: LEFT FOOT - COMPLETE 3+ VIEW COMPARISON:  October 18, 2020 FINDINGS: Stable postsurgical changes from left third toe  amputation. Sclerotic changes of the periosteum of the second toe which may be due to osteomyelitis. Dystrophic changes of the calcaneus with sclerotic appearance of the remaining bone, not significantly changed from the prior radiograph. Diffuse soft tissue swelling and probable also overlying the calcaneus. Skin staples are noted at the surgical site. IMPRESSION: 1. Sclerotic changes of the periosteum of the second toe which may be due to osteomyelitis. 2. Dystrophic changes of the calcaneus with sclerotic appearance of the remaining bone, not significantly changed from the prior radiograph, and also concerning for osteomyelitis. Electronically Signed   By: Ted Mcalpine M.D.   On: 10/20/2020 14:54      Assessment/Plan Active Problems:   Uncontrolled type 2 diabetes mellitus (HCC)   Osteomyelitis of second toe of left foot (HCC)   Pernicious anemia   Osteomyelitis (HCC)    Left foot second toe osteomyelitis plan for surgery later today by  Dr. Samuella CotaPrice podiatrist.  On empiric antibiotics.  We will keep patient n.p.o. Diabetes mellitus type 2 uncontrolled with hyperglycemia takes Levemir 28 units at bedtime.  We will keep patient on sliding scale coverage.  Closely follow CBGs.  Check hemoglobin A1c with next blood draw. History of hypertension presently n.p.o. usually takes losartan.  As needed IV hydralazine. History of pernicious anemia on p.o. B12 supplements which can be restarted after patient's surgery.   DVT prophylaxis: SCDs.  Avoiding anticoagulation in anticipation of possible surgery. Code Status: Full code. Family Communication: Discussed with patient. Disposition Plan: Home when stable. Consults called: Health and safety inspectorodiatric surgeon. Admission status: Observation.   Eduard ClosArshad N Micahel Omlor MD [facility] Pager [phone].  If 7PM-7AM, please contact night-coverage [email or web address] Password [email or web address]  10/20/2020, 3:27 PM

## 2020-10-20 NOTE — Plan of Care (Signed)
As patient had full meal at 11am plan for surgery tomorrow AM for left 2nd toe amputation

## 2020-10-20 NOTE — ED Triage Notes (Addendum)
Pt arrived via walk in c/o left great toe pain x3 days. Has ulcer on foot, currently being treated for

## 2020-10-20 NOTE — Progress Notes (Signed)
Pharmacy Antibiotic Note  [name] is a 50 y.o. male with hx DM and chronic osteomyelitis of left ankle/heel, who was instructed by his podiatrist to come to the ED on 7/16 for evaluation of left second toe pain/infection. Dr. Kandice Hams note on 7/14 noted that x-ray showed "osteolysis of the 2nd toe proximal and middle phalanges consistent with osteomyelitis." Plan is for toe amputation. Pharmacy has been consulted to start vancomycin and cefepime for infection.  Plan: - cefepime 2gm IV q8h - vancomycin 1750 mg IV x1, then 1500 mg q12h for est AUC 464  _____________________________________________  Temp (24hrs), Avg:97.8 F (36.6 C), Min:97.8 F (36.6 C), Max:97.8 F (36.6 C)  Recent Labs  Lab 10/20/20 1431  WBC 4.8  CREATININE 0.59*  LATICACIDVEN 2.5*    CrCl cannot be calculated (Unknown ideal weight.).    Allergies  Allergen Reactions   Piperacillin Hives   Tazobactam Hives     Thank you for allowing pharmacy to be a part of this patient's care.  Lucia Gaskins 10/20/2020 3:48 PM

## 2020-10-20 NOTE — ED Provider Notes (Signed)
[facility]-EMERGENCY DEPT Provider Note   CSN: [id number] Arrival date & time: 10/20/20  1320     History Chief Complaint  Patient presents with   Toe Pain    [name] is a 50 y.o. male with a history of diabetes, left knee osteomyelitis, kidney disease.  Patient presents emergency department with a chief complaint of infection to left second toe.  Patient states that his podiatrist Dr. Samuella Cota told him to come to the emergency department so he can be admitted for amputation of affected toe.  Patient endorses swelling and erythema to second left toe.  Patient has had no change in erythema or swelling since 7/14 when he was evaluated by his podiatrist.  Patient denies any fevers, chills, nausea, vomiting, numbness, weakness.  Last oral intake today 11am.   Per chart review patient has history of wounds to left foot.  Patient was seen by his podiatrist Dr. Samuella Cota on 7/14.  Left second toe ulceration was worsening at that time, patient did not have signs of infection or ascending cellulitis.  Patient was started on suppressive antibiotics with plan to have amputation performed in the future.   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 No pertinent surgical history.     History reviewed. No pertinent family history.  Social History   Tobacco Use   Smoking status: Former   Smokeless tobacco: Never  Substance Use Topics   Alcohol use: No   Drug use: No    Home Medications Prior to Admission medications   Medication Sig Start Date End Date Taking? Authorizing Provider  calcium carbonate (OSCAL) 1500 (600 Ca) MG TABS tablet Take by mouth 2 (two) times daily with a meal.    [provider]  cyanocobalamin (,VITAMIN B-12,) 1000 MCG/ML injection 1,000 mcg. 05/16/20   [provider]  diclofenac (VOLTAREN) 50 MG EC tablet Take 50 mg by mouth 3 (three) times daily.    [provider]  doxycycline (VIBRA-TABS) 100 MG tablet Take 1 tablet (100 mg total) by mouth 2 (two) times daily. 10/18/20   Park Liter, DPM  famotidine (PEPCID) 20 MG tablet Take 20 mg by mouth 2 (two) times daily. 08/07/20   [provider]  ferrous sulfate 324 MG TBEC 324 mg. 05/16/20   [provider]  HUMALOG KWIKPEN 100 UNIT/ML KwikPen INJECT 12 UNITS UNDER THE SKIN 3 TIMES DAILY WITH MEALS 07/11/20   [provider]  insulin aspart (NOVOLOG) 100 UNIT/ML injection Inject 12 Units into the skin. 2 times daily 05/09/19   [provider]  insulin detemir (LEVEMIR) 100 UNIT/ML FlexPen Inject into  the skin. 32 units nightly 05/09/19   [provider]  Insulin Glargine (BASAGLAR KWIKPEN) 100 UNIT/ML INJECT 32 UNITS UNDER THE SKIN NIGHTLY (START ONLY AFTER RUNNING OUT OF LEVEMIR) 07/11/20   [provider]  levofloxacin (LEVAQUIN) 750 MG tablet Take 750 mg by mouth daily. 08/30/20   [provider]  loratadine (CLARITIN) 10 MG tablet Take 10 mg by mouth daily. 06/23/20   [provider]  losartan (COZAAR) 25 MG tablet Take 25 mg by mouth daily. 06/17/20   [provider]  Magnesium 250 MG TABS Take by mouth.    [provider]  ondansetron (ZOFRAN-ODT) 4 MG disintegrating tablet 4 mg. 05/16/20   [provider]  oxyCODONE (OXY IR/ROXICODONE) 5 MG immediate release tablet 5 MG PO TID As Needed for Moderate To Severe Pain 05/16/20   [provider]  predniSONE (DELTASONE) 20 MG tablet Take 20 mg by mouth daily. 08/07/20   [provider]  sildenafil (REVATIO) 20 MG tablet Take by mouth. 10/09/20   [provider]  silver sulfADIAZINE (SILVADENE) 1 % cream Apply pea-sized amount to wound daily. 07/16/20   Park Liter, DPM  sulfamethoxazole-trimethoprim (BACTRIM DS) 800-160 MG tablet Take 1 tablet by mouth 2 (two) times daily. 09/10/20   Park Liter, DPM    Allergies    Piperacillin and Tazobactam  Review of Systems   Review of Systems  Constitutional:  Negative for chills and fever.  Gastrointestinal:  Negative for nausea and vomiting.  Musculoskeletal:  Positive for arthralgias.  Skin:  Positive for color change and wound. Negative for pallor and rash.  Neurological:  Negative for weakness and numbness.  Psychiatric/Behavioral:  Negative for confusion.    Physical Exam Updated Vital Signs BP 136/87 (BP Location: Left Arm)   Pulse 79   Temp 97.8 F (36.6 C) (Oral)   Resp 16   SpO2 99%   Physical Exam Vitals and nursing note reviewed.  Constitutional:      General: He is not in acute distress.    Appearance: He is not ill-appearing, toxic-appearing or diaphoretic.  HENT:     Head: Normocephalic.  Eyes:     General: No scleral icterus.       Right eye: No discharge.        Left eye: No discharge.  Cardiovascular:     Rate and Rhythm: Normal rate.     Pulses:          Dorsalis pedis pulses are 2+ on the left side.  Pulmonary:     Effort: Pulmonary effort is normal.  Feet:     Left foot:     Skin integrity: Ulcer, erythema, warmth and dry skin present.     Toenail Condition: Left  toenails are abnormally thick.     Comments: Patient has swelling to second left toe, ulcer noted to dorsum of affected toe without purulent discharge.  Erythema does not extend past metatarsophalangeal joint.  Patient has amputation to left third digit.  +1 pedal edema throughout entire left foot. Skin:    General: Skin is warm and dry.  Neurological:     General: No focal deficit present.     Mental Status: He is alert.  Psychiatric:        Behavior: Behavior is cooperative.      ED Results / Procedures / Treatments   Labs (all labs ordered are listed, but only abnormal results are displayed) Labs Reviewed  RESP PANEL BY RT-PCR (FLU  A&B, COVID) [id number]  BASIC METABOLIC PANEL  CBC WITH DIFFERENTIAL/PLATELET  LACTIC ACID, PLASMA  LACTIC ACID, PLASMA    EKG None  Radiology No results found.  Procedures Procedures   Medications Ordered in ED Medications - No data to display  ED Course  I have reviewed the triage vital signs and the nursing notes.  Pertinent labs & imaging results that were available during my care of the patient were reviewed by me and considered in my medical decision making (see chart for details).    MDM Rules/Calculators/A&P                          Alert 50 year old male no acute distress, nontoxic-appearing.  Patient presents emerged part with chief complaint of infection to left second toe.  Patient states that he was told by his podiatrist Dr. Samuella Cota to come to the emergency department to be admitted for amputation of affected toe.  Patient denies any change in swelling, erythema, fever, chills, nausea, vomiting, diarrhea.  Pulse, motor, and sensation intact to left foot.    1353 spoke to podiatrist Dr. Samuella Cota who reports that he wants patient admitted by hospitalist team so patient can have amputation of his affected toe. Does not need for antibiotic treatment, Dr. Samuella Cota advised to hold any antibiotics at this time.  1422 Spoke to hospitalist  Dr.Kakrakandy who will see the patient for admission.  Final Clinical Impression(s) / ED Diagnoses Final diagnoses:  Toe infection  Osteomyelitis of left foot, unspecified type [facility])    Rx / DC Orders ED Discharge Orders     None        Berneice Heinrich 10/20/20 1545    Margarita Grizzle, MD 10/20/20 2318

## 2020-10-20 NOTE — Plan of Care (Signed)

## 2020-10-21 ENCOUNTER — Observation Stay (HOSPITAL_COMMUNITY): Payer: Medicaid Other | Admitting: Anesthesiology

## 2020-10-21 ENCOUNTER — Encounter (HOSPITAL_COMMUNITY): Payer: Self-pay | Admitting: Internal Medicine

## 2020-10-21 ENCOUNTER — Observation Stay (HOSPITAL_COMMUNITY): Payer: Medicaid Other

## 2020-10-21 ENCOUNTER — Encounter (HOSPITAL_COMMUNITY)
Admission: EM | Disposition: A | Discharge status: Home / Self Care | Payer: Self-pay | Source: Home / Self Care | Attending: Emergency Medicine

## 2020-10-21 DIAGNOSIS — M869 Osteomyelitis, unspecified: Secondary | ICD-10-CM

## 2020-10-21 DIAGNOSIS — M86172 Other acute osteomyelitis, left ankle and foot: Secondary | ICD-10-CM | POA: Diagnosis not present

## 2020-10-21 DIAGNOSIS — E119 Type 2 diabetes mellitus without complications: Secondary | ICD-10-CM | POA: Diagnosis not present

## 2020-10-21 DIAGNOSIS — I10 Essential (primary) hypertension: Secondary | ICD-10-CM | POA: Diagnosis not present

## 2020-10-21 DIAGNOSIS — E1165 Type 2 diabetes mellitus with hyperglycemia: Secondary | ICD-10-CM

## 2020-10-21 DIAGNOSIS — Z20822 Contact with and (suspected) exposure to covid-19: Secondary | ICD-10-CM | POA: Diagnosis not present

## 2020-10-21 DIAGNOSIS — L089 Local infection of the skin and subcutaneous tissue, unspecified: Secondary | ICD-10-CM

## 2020-10-21 DIAGNOSIS — D510 Vitamin B12 deficiency anemia due to intrinsic factor deficiency: Secondary | ICD-10-CM

## 2020-10-21 HISTORY — PX: AMPUTATION TOE: SHX6595

## 2020-10-21 LAB — CBC
HCT: 37.5 % — ABNORMAL LOW (ref 39.0–52.0)
Hemoglobin: 12.3 g/dL — ABNORMAL LOW (ref 13.0–17.0)
MCH: 30.4 pg (ref 26.0–34.0)
MCHC: 32.8 g/dL (ref 30.0–36.0)
MCV: 92.6 fL (ref 80.0–100.0)
Platelets: 228 10*3/uL (ref 150–400)
RBC: 4.05 MIL/uL — ABNORMAL LOW (ref 4.22–5.81)
RDW: 13.6 % (ref 11.5–15.5)
WBC: 5 10*3/uL (ref 4.0–10.5)
nRBC: 0 % (ref 0.0–0.2)

## 2020-10-21 LAB — BASIC METABOLIC PANEL
Anion gap: 6 (ref 5–15)
BUN: 15 mg/dL (ref 6–20)
CO2: 28 mmol/L (ref 22–32)
Calcium: 8.6 mg/dL — ABNORMAL LOW (ref 8.9–10.3)
Chloride: 105 mmol/L (ref 98–111)
Creatinine, Ser: 0.66 mg/dL (ref 0.61–1.24)
GFR, Estimated: >60 mL/min (ref >60)
Glucose, Bld: 61 mg/dL — ABNORMAL LOW (ref 70–99)
Potassium: 3.7 mmol/L (ref 3.5–5.1)
Sodium: 139 mmol/L (ref 135–145)

## 2020-10-21 LAB — GLUCOSE, CAPILLARY
Glucose-Capillary: 93 mg/dL (ref 70–99)
Glucose-Capillary: 74 mg/dL (ref 70–99)
Glucose-Capillary: 138 mg/dL — ABNORMAL HIGH (ref 70–99)
Glucose-Capillary: 234 mg/dL — ABNORMAL HIGH (ref 70–99)

## 2020-10-21 LAB — HIV ANTIBODY (ROUTINE TESTING W REFLEX): HIV Screen 4th Generation wRfx: NONREACTIVE

## 2020-10-21 SURGERY — AMPUTATION, TOE
Anesthesia: Monitor Anesthesia Care | Site: Toe | Laterality: Left

## 2020-10-21 MED ORDER — LOSARTAN POTASSIUM 25 MG PO TABS: 25.0000 mg | ORAL_TABLET | Freq: Every day | ORAL | Status: DC

## 2020-10-21 MED ORDER — PROPOFOL 500 MG/50ML IV EMUL
INTRAVENOUS | Status: DC | PRN
  Administered 2020-10-21: 75 ug/kg/min via INTRAVENOUS

## 2020-10-21 MED ORDER — ONDANSETRON HCL 4 MG/2ML IJ SOLN: 4.0000 mg | Freq: Once | INTRAMUSCULAR | Status: DC | PRN

## 2020-10-21 MED ORDER — BUPIVACAINE HCL (PF) 0.5 % IJ SOLN
INTRAMUSCULAR | Status: DC | PRN
  Administered 2020-10-21: 10 mL

## 2020-10-21 MED ORDER — VANCOMYCIN HCL 1000 MG IV SOLR
INTRAVENOUS | Status: AC
  Filled 2020-10-21: qty 1000

## 2020-10-21 MED ORDER — VANCOMYCIN HCL 1750 MG/350ML IV SOLN
1750.0000 mg | Freq: Once | INTRAVENOUS | Status: AC
  Administered 2020-10-21: 1750 mg via INTRAVENOUS
  Filled 2020-10-21: qty 350

## 2020-10-21 MED ORDER — 0.9 % SODIUM CHLORIDE (POUR BTL) OPTIME
TOPICAL | Status: DC | PRN
  Administered 2020-10-21: 1000 mL

## 2020-10-21 MED ORDER — FENTANYL CITRATE (PF) 100 MCG/2ML IJ SOLN: 25.0000 ug | INTRAMUSCULAR | Status: DC | PRN

## 2020-10-21 MED ORDER — BUPIVACAINE HCL (PF) 0.5 % IJ SOLN
INTRAMUSCULAR | Status: AC
  Filled 2020-10-21: qty 30

## 2020-10-21 MED ORDER — MIDAZOLAM HCL 2 MG/2ML IJ SOLN
INTRAMUSCULAR | Status: AC
  Filled 2020-10-21: qty 2

## 2020-10-21 MED ORDER — LACTATED RINGERS IV SOLN: INTRAVENOUS | Status: DC | PRN

## 2020-10-21 SURGICAL SUPPLY — 38 items
BAG COUNTER SPONGE SURGICOUNT (BAG) IMPLANT
BAG SPNG CNTER NS LX DISP (BAG)
BLADE SURG 15 STRL LF DISP TIS (BLADE) IMPLANT
BLADE SURG 15 STRL SS (BLADE)
BNDG CONFORM 4 STRL LF (GAUZE/BANDAGES/DRESSINGS) ×2 IMPLANT
BNDG ELASTIC 4X5.8 VLCR STR LF (GAUZE/BANDAGES/DRESSINGS) ×1 IMPLANT
BNDG ELASTIC 6X5.8 VLCR STR LF (GAUZE/BANDAGES/DRESSINGS) ×2 IMPLANT
BNDG GAUZE ELAST 4 BULKY (GAUZE/BANDAGES/DRESSINGS) ×1 IMPLANT
CLEANER TIP ELECTROSURG 2X2 (MISCELLANEOUS) IMPLANT
CNTNR URN SCR LID CUP LEK RST (MISCELLANEOUS) IMPLANT
CONT SPEC 4OZ STRL OR WHT (MISCELLANEOUS)
COVER SURGICAL LIGHT HANDLE (MISCELLANEOUS) ×2 IMPLANT
CUFF TOURN SGL QUICK 24 (TOURNIQUET CUFF)
CUFF TRNQT CYL 24X4X16.5-23 (TOURNIQUET CUFF) IMPLANT
DECANTER SPIKE VIAL GLASS SM (MISCELLANEOUS) IMPLANT
GAUZE SPONGE 4X4 12PLY STRL (GAUZE/BANDAGES/DRESSINGS) ×2 IMPLANT
GAUZE XEROFORM 1X8 LF (GAUZE/BANDAGES/DRESSINGS) ×2 IMPLANT
GLOVE SRG 8 PF TXTR STRL LF DI (GLOVE) ×1 IMPLANT
GLOVE SURG ENC MOIS LTX SZ8 (GLOVE) ×2 IMPLANT
GLOVE SURG LTX SZ8 (GLOVE) ×2 IMPLANT
GLOVE SURG UNDER POLY LF SZ8 (GLOVE) ×2
GOWN STRL REUS W/TWL XL LVL3 (GOWN DISPOSABLE) ×2 IMPLANT
KIT BASIN OR (CUSTOM PROCEDURE TRAY) ×2 IMPLANT
KIT TURNOVER KIT A (KITS) ×2 IMPLANT
NDL HYPO 25X1 1.5 SAFETY (NEEDLE) ×1 IMPLANT
NEEDLE HYPO 25X1 1.5 SAFETY (NEEDLE) ×2 IMPLANT
PACK ORTHO EXTREMITY (CUSTOM PROCEDURE TRAY) ×2 IMPLANT
PADDING UNDERCAST 2 STRL (CAST SUPPLIES) ×1
PADDING UNDERCAST 2X4 STRL (CAST SUPPLIES) ×1 IMPLANT
PENCIL SMOKE EVACUATOR (MISCELLANEOUS) ×2 IMPLANT
STAPLER VISISTAT 35W (STAPLE) ×2 IMPLANT
SUT ETHILON 4 0 PS 2 18 (SUTURE) ×2 IMPLANT
SUT VIC AB 4-0 PS2 27 (SUTURE) ×2 IMPLANT
SYR 20ML LL LF (SYRINGE) IMPLANT
TOWEL OR 17X26 10 PK STRL BLUE (TOWEL DISPOSABLE) ×2 IMPLANT
TOWEL OR NON WOVEN STRL DISP B (DISPOSABLE) ×2 IMPLANT
TRAY PREP A LATEX SAFE STRL (SET/KITS/TRAYS/PACK) ×2 IMPLANT
UNDERPAD 30X36 HEAVY ABSORB (UNDERPADS AND DIAPERS) ×4 IMPLANT

## 2020-10-21 NOTE — Plan of Care (Signed)
  Problem: Clinical Measurements: Goal: Respiratory complications will improve Outcome: Progressing   Problem: Clinical Measurements: Goal: Cardiovascular complication will be avoided Outcome: Progressing   Problem: Coping: Goal: Level of anxiety will decrease Outcome: Progressing   

## 2020-10-21 NOTE — Progress Notes (Signed)
Progress Note    [name]  GDJ:[id number] DOB: [birth year]  DOA: 10/20/2020 PCP: [place], Sarah T, MD    Brief Narrative:     Medical records reviewed and are as summarized below:  [name] is an 50 y.o. male with history of diabetes mellitus type 2, hypertension, pernicious anemia who has had a recent amputation of the left foot third toe for osteomyelitis has been noticing increasing swelling of the left second toe and has been following up with podiatrist Dr. Samuella Cota.  Work-up showed possible osteomyelitis of the left foot second toe and was advised to come to the ER.  Patient was on antibiotics.  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  As needed IV hydralazine.  History of pernicious anemia  -on p.o. B12 supplements          Family Communication/Anticipated D/C date and plan/Code Status   DVT prophylaxis: scd Code Status: Full Code.  Disposition Plan: Status is: Observation  The patient will require care spanning > 2 midnights and should be moved to inpatient because: Inpatient level of care appropriate due to severity of illness  Dispo: The patient is from: Home              Anticipated d/c is to: Home              Patient currently is not medically stable to d/c.   Difficult to place patient No         Medical Consultants:   podiatry  Subjective:   No SOB, no CP-- asking about how long he will be in the hospital  Objective:    Vitals:   10/20/20 1620 10/20/20 1734 10/21/20 0200 10/21/20 0600  BP:  (!) 152/92 140/81 (!) 142/84  Pulse:  60  72  Resp:  16   15  Temp:  (!) 97.5 F (36.4 C) 98.3 F (36.8 C) 98 F (36.7 C)  TempSrc:  Oral Oral Oral  SpO2:  100% 97% 98%  Weight: 81.6 kg     Height: 6\' 3"  (1.905 m)       Intake/Output Summary (Last 24 hours) at 10/21/2020 1019 Last data filed at 10/21/2020 10/23/2020 Gross per 24 hour  Intake 2171.66 ml  Output 450 ml  Net 1721.66 ml   Filed Weights   10/20/20 1620  Weight: 81.6 kg    Exam:  General: Appearance:    Well developed, well nourished male in no acute distress     Lungs:     respirations unlabored  Heart:    Normal heart rate. Normal rhythm. No murmurs, rubs, or gallops.    MS:   All extremities are intact. Left 2nd toes swollen and red   Neurologic:   Awake, alert, oriented x 3. No apparent focal neurological           defect.      Data Reviewed:   I have personally reviewed following labs and imaging studies:  Labs: Labs show the following:   Basic Metabolic Panel: Recent Labs  Lab 10/20/20 1431 10/21/20 0307  NA 137 139  K 4.2  3.7  CL 102 105  CO2 26 28  GLUCOSE 315* 61*  BUN 16 15  CREATININE 0.59* 0.66  CALCIUM 8.8* 8.6*   GFR Estimated Creatinine Clearance: 126.1 mL/min (by C-G formula based on SCr of 0.66 mg/dL). Liver Function Tests: No results for input(s): AST, ALT, ALKPHOS, BILITOT, PROT, ALBUMIN in the last 168 hours. No results for input(s): LIPASE, AMYLASE in the last 168 hours. No results for input(s): AMMONIA in the last 168 hours. Coagulation profile No results for input(s): INR, PROTIME in the last 168 hours.  CBC: Recent Labs  Lab 10/20/20 1431 10/21/20 0307  WBC 4.8 5.0  NEUTROABS 2.7  --   HGB 12.8* 12.3*  HCT 38.0* 37.5*  MCV 90.9 92.6  PLT 244 228   Cardiac Enzymes: No results for input(s): CKTOTAL, CKMB, CKMBINDEX, TROPONINI in the last 168 hours. BNP (last 3 results) No results for input(s): PROBNP in the last 8760 hours. CBG: Recent Labs  Lab 10/20/20 1524 10/20/20 2031 10/21/20 0117 10/21/20 0552  10/21/20 0914  GLUCAP 264* 263* 93 74 138*   D-Dimer: No results for input(s): DDIMER in the last 72 hours. Hgb A1c: No results for input(s): HGBA1C in the last 72 hours. Lipid Profile: No results for input(s): CHOL, HDL, LDLCALC, TRIG, CHOLHDL, LDLDIRECT in the last 72 hours. Thyroid function studies: No results for input(s): TSH, T4TOTAL, T3FREE, THYROIDAB in the last 72 hours.  Invalid input(s): FREET3 Anemia work up: No results for input(s): VITAMINB12, FOLATE, FERRITIN, TIBC, IRON, RETICCTPCT in the last 72 hours. Sepsis Labs: Recent Labs  Lab 10/20/20 1431 10/20/20 1559 10/21/20 0307  WBC 4.8  --  5.0  LATICACIDVEN 2.5* 3.2*  --     Microbiology Recent Results (from the past 240 hour(s))  Resp Panel by RT-PCR (Flu A&B, Covid) Nasopharyngeal Swab     Status: None   Collection Time: 10/20/20  2:31 PM   Specimen: Nasopharyngeal Swab; Nasopharyngeal(NP) swabs in vial transport medium  Result Value Ref Range Status   SARS Coronavirus 2 by RT PCR NEGATIVE NEGATIVE Final    Comment: (NOTE) SARS-CoV-2 target nucleic acids are NOT DETECTED.  The SARS-CoV-2 RNA is generally detectable in upper respiratory specimens during the acute phase of infection. The lowest concentration of SARS-CoV-2 viral copies this assay can detect is 138 copies/mL. A negative result does not preclude SARS-Cov-2 infection and should not be used as the sole basis for treatment or other patient management decisions. A negative result may occur with  improper specimen collection/handling, submission of specimen other than nasopharyngeal swab, presence of viral mutation(s) within the areas targeted by this assay, and inadequate number of viral copies(<138 copies/mL). A negative result must be combined with clinical observations, patient history, and epidemiological information. The expected result is Negative.  Fact Sheet for Patients:  BloggerCourse.com  Fact Sheet for  Healthcare Providers:  SeriousBroker.it  This test is no t yet approved or cleared by the [place] FDA and  has been authorized for detection and/or diagnosis of SARS-CoV-2 by FDA under an Emergency Use Authorization (EUA). This EUA will remain  in effect (meaning this test can be used) for the duration of the COVID-19 declaration under Section 564(b)(1) of the Act, 21 U.S.C.section 360bbb-3(b)(1), unless the authorization is terminated  or revoked sooner.       Influenza A by PCR NEGATIVE NEGATIVE Final   Influenza B by PCR NEGATIVE NEGATIVE Final    Comment: (NOTE) The Xpert Xpress SARS-CoV-2/FLU/RSV plus assay is intended as an aid  in the diagnosis of influenza from Nasopharyngeal swab specimens and should not be used as a sole basis for treatment. Nasal washings and aspirates are unacceptable for Xpert Xpress SARS-CoV-2/FLU/RSV testing.  Fact Sheet for Patients: BloggerCourse.com  Fact Sheet for Healthcare Providers: SeriousBroker.it  This test is not yet approved or cleared by the [place] FDA and has been authorized for detection and/or diagnosis of SARS-CoV-2 by FDA under an Emergency Use Authorization (EUA). This EUA will remain in effect (meaning this test can be used) for the duration of the COVID-19 declaration under Section 564(b)(1) of the Act, 21 U.S.C. section 360bbb-3(b)(1), unless the authorization is terminated or revoked.  Performed at [facility], [address], [place], [place] [address]   MRSA Next Gen by PCR, Nasal     Status: None   Collection Time: 10/20/20  7:30 PM   Specimen: Nasal Mucosa; Nasal Swab  Result Value Ref Range Status   MRSA by PCR Next Gen NOT DETECTED NOT DETECTED Final    Comment: (NOTE) The GeneXpert MRSA Assay (FDA approved for NASAL specimens only), is one component of a comprehensive MRSA colonization  surveillance program. It is not intended to diagnose MRSA infection nor to guide or monitor treatment for MRSA infections. Test performance is not FDA approved in patients less than 69 years old. Performed at [facility], [address], [place], [place] [address]     Procedures and diagnostic studies:  DG Foot Complete Left  Result Date: 10/20/2020 CLINICAL DATA:  Concern for osteomyelitis. EXAM: LEFT FOOT - COMPLETE 3+ VIEW COMPARISON:  October 18, 2020 FINDINGS: Stable postsurgical changes from left third toe amputation. Sclerotic changes of the periosteum of the second toe which may be due to osteomyelitis. Dystrophic changes of the calcaneus with sclerotic appearance of the remaining bone, not significantly changed from the prior radiograph. Diffuse soft tissue swelling and probable also overlying the calcaneus. Skin staples are noted at the surgical site. IMPRESSION: 1. Sclerotic changes of the periosteum of the second toe which may be due to osteomyelitis. 2. Dystrophic changes of the calcaneus with sclerotic appearance of the remaining bone, not significantly changed from the prior radiograph, and also concerning for osteomyelitis. Electronically Signed   By: Ted Mcalpine M.D.   On: 10/20/2020 14:54    Medications:    insulin aspart  0-9 Units Subcutaneous Q4H   insulin detemir  25 Units Subcutaneous QHS   Continuous Infusions:  ceFEPime (MAXIPIME) IV 2 g (10/21/20 0748)   lactated ringers 75 mL/hr at 10/21/20 0725   vancomycin       LOS: 0 days   Joseph Art  [facility]   How to contact the [facility] Attending or Consulting provider 7A - 7P or covering provider during after hours 7P -7A, for this patient?  Check the care team in [facility] and look for a) attending/consulting [facility] provider listed and b) the [facility] team listed Log into [email or web address] and use 's universal password to access. If you do not have the password, please contact the  hospital operator. Locate the [facility] provider you are looking for under [facility] and page to a number that you can be directly reached. If you still have difficulty reaching the provider, please page the [facility] (Director on Call) for the Hospitalists listed on amion for assistance.  10/21/2020, 10:19 AM

## 2020-10-21 NOTE — Plan of Care (Signed)
  Problem: Education: Goal: Knowledge of General Education information will improve Description: Including pain rating scale, medication(s)/side effects and non-pharmacologic comfort measures Outcome: Progressing   Problem: Nutrition: Goal: Adequate nutrition will be maintained Outcome: Progressing   Problem: Elimination: Goal: Will not experience complications related to urinary retention Outcome: Progressing   

## 2020-10-21 NOTE — Anesthesia Postprocedure Evaluation (Signed)
Anesthesia Post Note  Patient: [name]  Procedure(s) Performed: LEFT SECOND TOE AMPUTATION (Left: Toe)     Patient location during evaluation: PACU Anesthesia Type: MAC Level of consciousness: awake and alert and oriented Pain management: pain level controlled Vital Signs Assessment: post-procedure vital signs reviewed and stable Respiratory status: spontaneous breathing, nonlabored ventilation and respiratory function stable Cardiovascular status: stable and blood pressure returned to baseline Postop Assessment: no apparent nausea or vomiting Anesthetic complications: no   No notable events documented.  Last Vitals:  Vitals:   10/21/20 1107 10/21/20 1110  BP:  120/74  Pulse:  63  Resp: 15 14  Temp:    SpO2:  100%    Last Pain:  Vitals:   10/21/20 1106  TempSrc:   PainSc: Asleep                 [name]

## 2020-10-21 NOTE — Progress Notes (Signed)
Pt back from pacu in stable condition. No needs at time of transfer.

## 2020-10-21 NOTE — Anesthesia Preprocedure Evaluation (Addendum)
Anesthesia Evaluation  Patient identified by MRN, date of birth, ID band Patient awake    Reviewed: Allergy & Precautions, NPO status , Patient's Chart, lab work & pertinent test results, reviewed documented beta blocker date and time   Airway Mallampati: II  TM Distance: >3 FB Neck ROM: Full    Dental  (+) Poor Dentition, Missing, Chipped, Dental Advisory Given,    Pulmonary former smoker,    breath sounds clear to auscultation + decreased breath sounds      Cardiovascular hypertension, Pt. 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 toe Hx/o tick bite and cellulitis   Abdominal   Peds  Hematology  (+) anemia ,   Anesthesia Other Findings   Reproductive/Obstetrics ED                          Anesthesia Physical Anesthesia Plan  ASA: 3  Anesthesia Plan: MAC   Post-op Pain Management:    Induction: Intravenous  PONV Risk Score and Plan: 2 and Treatment may vary due to age or medical condition, Propofol infusion and Midazolam  Airway Management Planned: Natural Airway and Simple Face Mask  Additional Equipment:   Intra-op Plan:   Post-operative Plan:   Informed Consent: I have reviewed the patients History and Physical, chart, labs and discussed the procedure including the risks, benefits and alternatives for the proposed anesthesia with the patient or authorized representative who has indicated his/her understanding and acceptance.     Dental advisory given  Plan Discussed with: CRNA and Anesthesiologist  Anesthesia Plan Comments:         Anesthesia Quick Evaluation

## 2020-10-21 NOTE — Plan of Care (Signed)
Pt stable at this time. Pt to d/c home when documentation and instructions are complete.

## 2020-10-21 NOTE — Transfer of Care (Signed)
Immediate Anesthesia Transfer of Care Note  Patient: [name]  Procedure(s) Performed: LEFT SECOND TOE AMPUTATION (Left: Toe)  Patient Location: PACU  Anesthesia Type:MAC  Level of Consciousness: sedated, patient cooperative and responds to stimulation  Airway & Oxygen Therapy: Patient Spontanous Breathing and Patient connected to face mask oxygen  Post-op Assessment: Report given to RN and Post -op Vital signs reviewed and stable  Post vital signs: Reviewed and stable  Last Vitals:  Vitals Value Taken Time  BP    Temp    Pulse    Resp    SpO2      Last Pain:  Vitals:   10/21/20 0725  TempSrc:   PainSc: 0-No pain         Complications: No notable events documented.

## 2020-10-21 NOTE — Progress Notes (Signed)
Pt down to PACU in stable condition. No needs at time of transfer. Rn will continue to monitor.

## 2020-10-21 NOTE — Op Note (Signed)
  Patient Name: [name] DOB: 05/20/1970  MRN: [id number]   Date of Surgery: 10/21/20  Surgeon: Dr. Hardie Pulley, DPM Assistants: none  Pre-operative Diagnosis:  Ulcer left 2nd toe osteomyelitis Post-operative Diagnosis:  same Procedures:  1) Amputation left 2nd toe Pathology/Specimens: ID Type Source Tests Collected by Time Destination  1 : left second toe Tissue PATH Digit amputation SURGICAL PATHOLOGY Evelina Bucy, DPM 10/21/2020 1044    Anesthesia: MAC Hemostasis: * No tourniquets in log * Estimated Blood Loss: 20 Materials: * No implants in log * Medications: none Complications: none  Indications for Procedure:  This is a 50 y.o. male with an ulcer to the left 2nd toe with evidence of OM. He presents for amputation of the digit for resolution of infection   Procedure in Detail: Patient was identified in pre-operative holding area. Formal consent was signed and the left lower extremity was marked. Patient was brought back to the operating room. Anesthesia was induced. The extremity was prepped and draped in the usual sterile fashion. Timeout was taken to confirm patient name, laterality, and procedure prior to incision.   Attention was then directed to the left second toe where an incision was made at the dorsal aspect of the digit. Dissection was carried down to level of bone.  Dissection was continued to the MPJ joint and all collateral ligaments were freed at the joint.  The bone soft tissue attachments of the proximal phalanx were removed and passed for pathology.  The remaining metatarsal head appeared healthy and viable.  The area was copiously irrigated.  The skin was reapproximated with nylon and skin staples.  The foot was then dressed with xeroform, 4x4, kerlix, and ACE bandage. Patient tolerated the procedure well.   Disposition: Following a period of post-operative monitoring, patient will be transferred back to the floor. He will be ok for discharge later  today.

## 2020-10-21 NOTE — Progress Notes (Signed)
Pt stable at this time. No needs at time of d/c instructions and education. Pt left foot dressing clean, dry, and intact. Pt awaiting ride.

## 2020-10-21 NOTE — Progress Notes (Signed)
Orthopedic Tech Progress Note Patient Details:  [name] January 30, 1971 [id number]  Ortho Devices Type of Ortho Device: Postop shoe/boot Ortho Device/Splint Location: left Ortho Device/Splint Interventions: Application   Post Interventions Patient Tolerated: Well Instructions Provided: Care of device  Saul Fordyce 10/21/2020, 11:29 AM

## 2020-10-21 NOTE — Progress Notes (Signed)
  Subjective:  Patient ID: [name], male    DOB: 07-Oct-1970,  MRN: [id number]  Patient seen in pre-op. Understands plan for OR today. Objective:   Vitals:   10/21/20 0200 10/21/20 0600  BP: 140/81 (!) 142/84  Pulse:  72  Resp:  15  Temp: 98.3 F (36.8 C) 98 F (36.7 C)  SpO2: 97% 98%   General AA&O x3. Normal mood and affect.  Vascular Dorsalis pedis and posterior tibial pulses 2/4 bilat. Brisk capillary refill to all digits. Pedal Francis present.  Neurologic Epicritic sensation grossly intact.  Dermatologic Left 2nd toe edema, draining ulcer, erythema. LLE edema  Orthopedic: +Motor distally.    Assessment & Plan:  Patient was evaluated and treated and all questions answered.  OM left 2nd toe -Labs reviewed. -Imaging 7/16 reviewed. -Continue empiric Abx. -Consent reviewed and signed. LLE marked. Proceed to OR today for left 2nd toe amputation.  Park Liter, DPM  Accessible via secure chat for questions or concerns.

## 2020-10-21 NOTE — Discharge Summary (Signed)
Physician Discharge Summary  [name] ATF:[id number] DOB: [birth year] DOA: 10/20/2020  PCP: [place], Sarah T, MD  Admit date: 10/20/2020 Discharge date: 10/21/2020  Admitted From: home Discharge disposition: home   Recommendations for Outpatient Follow-Up:   Podiatry follow up   Discharge Diagnosis:   Active Problems:   Uncontrolled type 2 diabetes mellitus (HCC)   Osteomyelitis of second toe of left foot (HCC)   Pernicious anemia   Osteomyelitis (HCC)    Discharge Condition: Improved.  Diet recommendation: Carbohydrate-modified.  Wound care: None.  Code status: Full.   History of Present Illness:   [name] is a 50 y.o. male with history of diabetes mellitus type 2, hypertension, pernicious anemia who has had a recent amputation of the left foot third toe for osteomyelitis has been noticing increasing swelling of the left second toe and has been following up with podiatrist Dr. Samuella Cota.  Work-up showed possible osteomyelitis of the left foot second toe and was advised to come to the ER.  Patient was on antibiotics.   Hospital Course by Problem:   OM left 2nd toe -podiatry consult: OR  for left 2nd toe amputation -Following a period of post-operative monitoring, patient will be transferred back to the floor. He will be ok for discharge later today    Medical Consultants:   Podiatry    Discharge Exam:   Vitals:   10/21/20 1140 10/21/20 1153  BP: (!) 150/97 136/88  Pulse: 67 70  Resp: 14 18  Temp:  (!) 97.5 F (36.4 C)  SpO2: 100% 100%   Vitals:   10/21/20 1120 10/21/20 1130 10/21/20 1140 10/21/20 1153  BP:  138/78 (!) 150/97 136/88  Pulse: 61 68 67 70  Resp: 14 14 14 18   Temp:  97.8 F (36.6 C)  (!) 97.5 F (36.4 C)  TempSrc:    Oral  SpO2: 98% 99% 100% 100%  Weight:      Height:        General exam: Appears calm and comfortable.     The results of significant diagnostics from this hospitalization (including imaging,  microbiology, ancillary and laboratory) are listed below for reference.     Procedures and Diagnostic Studies:   DG Foot 2 Views Left  Result Date: 10/21/2020 CLINICAL DATA:  Left second toe amputation EXAM: LEFT FOOT - 2 VIEW COMPARISON:  10/20/2020 FINDINGS: Interval amputation of the left second toe at the second MTP joint. Prior third toe amputation with a small amount of residual bone or heterotopic ossification at the proximal resection margin. Chronic amputation changes of the posterior calcaneus with associated bony sclerosis and heterotopic calcification. Expected postoperative changes within the soft tissues at the second toe operative site. IMPRESSION: Interval amputation of the left second toe at the second MTP joint. Electronically Signed   By: 10/22/2020 D.O.   On: 10/21/2020 12:10   DG Foot Complete Left  Result Date: 10/20/2020 CLINICAL DATA:  Concern for osteomyelitis. EXAM: LEFT FOOT - COMPLETE 3+ VIEW COMPARISON:  October 18, 2020 FINDINGS: Stable postsurgical changes from left third toe amputation. Sclerotic changes of the periosteum of the second toe which may be due to osteomyelitis. Dystrophic changes of the calcaneus with sclerotic appearance of the remaining bone, not significantly changed from the prior radiograph. Diffuse soft tissue swelling and probable also overlying the calcaneus. Skin staples are noted at the surgical site. IMPRESSION: 1. Sclerotic changes of the periosteum of the second toe which may be due to osteomyelitis. 2.  Dystrophic changes of the calcaneus with sclerotic appearance of the remaining bone, not significantly changed from the prior radiograph, and also concerning for osteomyelitis. Electronically Signed   By: Ted Mcalpine M.D.   On: 10/20/2020 14:54     Labs:   Basic Metabolic Panel: Recent Labs  Lab 10/20/20 1431 10/21/20 0307  NA 137 139  K 4.2 3.7  CL 102 105  CO2 26 28  GLUCOSE 315* 61*  BUN 16 15  CREATININE 0.59* 0.66   CALCIUM 8.8* 8.6*   GFR Estimated Creatinine Clearance: 126.1 mL/min (by C-G formula based on SCr of 0.66 mg/dL). Liver Function Tests: No results for input(s): AST, ALT, ALKPHOS, BILITOT, PROT, ALBUMIN in the last 168 hours. No results for input(s): LIPASE, AMYLASE in the last 168 hours. No results for input(s): AMMONIA in the last 168 hours. Coagulation profile No results for input(s): INR, PROTIME in the last 168 hours.  CBC: Recent Labs  Lab 10/20/20 1431 10/21/20 0307  WBC 4.8 5.0  NEUTROABS 2.7  --   HGB 12.8* 12.3*  HCT 38.0* 37.5*  MCV 90.9 92.6  PLT 244 228   Cardiac Enzymes: No results for input(s): CKTOTAL, CKMB, CKMBINDEX, TROPONINI in the last 168 hours. BNP: Invalid input(s): POCBNP CBG: Recent Labs  Lab 10/20/20 1524 10/20/20 2031 10/21/20 0117 10/21/20 0552 10/21/20 0914  GLUCAP 264* 263* 93 74 138*   D-Dimer No results for input(s): DDIMER in the last 72 hours. Hgb A1c No results for input(s): HGBA1C in the last 72 hours. Lipid Profile No results for input(s): CHOL, HDL, LDLCALC, TRIG, CHOLHDL, LDLDIRECT in the last 72 hours. Thyroid function studies No results for input(s): TSH, T4TOTAL, T3FREE, THYROIDAB in the last 72 hours.  Invalid input(s): FREET3 Anemia work up No results for input(s): VITAMINB12, FOLATE, FERRITIN, TIBC, IRON, RETICCTPCT in the last 72 hours. Microbiology Recent Results (from the past 240 hour(s))  Resp Panel by RT-PCR (Flu A&B, Covid) Nasopharyngeal Swab     Status: None   Collection Time: 10/20/20  2:31 PM   Specimen: Nasopharyngeal Swab; Nasopharyngeal(NP) swabs in vial transport medium  Result Value Ref Range Status   SARS Coronavirus 2 by RT PCR NEGATIVE NEGATIVE Final    Comment: (NOTE) SARS-CoV-2 target nucleic acids are NOT DETECTED.  The SARS-CoV-2 RNA is generally detectable in upper respiratory specimens during the acute phase of infection. The lowest concentration of SARS-CoV-2 viral copies this assay  can detect is 138 copies/mL. A negative result does not preclude SARS-Cov-2 infection and should not be used as the sole basis for treatment or other patient management decisions. A negative result may occur with  improper specimen collection/handling, submission of specimen other than nasopharyngeal swab, presence of viral mutation(s) within the areas targeted by this assay, and inadequate number of viral copies(<138 copies/mL). A negative result must be combined with clinical observations, patient history, and epidemiological information. The expected result is Negative.  Fact Sheet for Patients:  BloggerCourse.com  Fact Sheet for Healthcare Providers:  SeriousBroker.it  This test is no t yet approved or cleared by the [place] FDA and  has been authorized for detection and/or diagnosis of SARS-CoV-2 by FDA under an Emergency Use Authorization (EUA). This EUA will remain  in effect (meaning this test can be used) for the duration of the COVID-19 declaration under Section 564(b)(1) of the Act, 21 U.S.C.section 360bbb-3(b)(1), unless the authorization is terminated  or revoked sooner.       Influenza A by PCR NEGATIVE NEGATIVE Final  Influenza B by PCR NEGATIVE NEGATIVE Final    Comment: (NOTE) The Xpert Xpress SARS-CoV-2/FLU/RSV plus assay is intended as an aid in the diagnosis of influenza from Nasopharyngeal swab specimens and should not be used as a sole basis for treatment. Nasal washings and aspirates are unacceptable for Xpert Xpress SARS-CoV-2/FLU/RSV testing.  Fact Sheet for Patients: BloggerCourse.com  Fact Sheet for Healthcare Providers: SeriousBroker.it  This test is not yet approved or cleared by the [place] FDA and has been authorized for detection and/or diagnosis of SARS-CoV-2 by FDA under an Emergency Use Authorization (EUA). This EUA will  remain in effect (meaning this test can be used) for the duration of the COVID-19 declaration under Section 564(b)(1) of the Act, 21 U.S.C. section 360bbb-3(b)(1), unless the authorization is terminated or revoked.  Performed at [facility], [address] [place], [place] [address]   MRSA Next Gen by PCR, Nasal     Status: None   Collection Time: 10/20/20  7:30 PM   Specimen: Nasal Mucosa; Nasal Swab  Result Value Ref Range Status   MRSA by PCR Next Gen NOT DETECTED NOT DETECTED Final    Comment: (NOTE) The GeneXpert MRSA Assay (FDA approved for NASAL specimens only), is one component of a comprehensive MRSA colonization surveillance program. It is not intended to diagnose MRSA infection nor to guide or monitor treatment for MRSA infections. Test performance is not FDA approved in patients less than 75 years old. Performed at [facility], [address] [place], [place] [address]      Discharge Instructions:   Discharge Instructions     Diet - low sodium heart healthy   Complete by: As directed    Diet Carb Modified   Complete by: As directed    Discharge wound care:   Complete by: As directed    Per podiatry   Increase activity slowly   Complete by: As directed       Allergies as of 10/21/2020       Reactions   Piperacillin Hives   Tazobactam Hives        Medication List     STOP taking these medications    doxycycline 100 MG tablet Commonly known as: VIBRA-TABS       TAKE these medications    Basaglar KwikPen 100 UNIT/ML INJECT 32 UNITS UNDER THE SKIN NIGHTLY (START ONLY AFTER RUNNING OUT OF LEVEMIR)   calcium carbonate 1500 (600 Ca) MG Tabs tablet Commonly known as: OSCAL Take by mouth 2 (two) times daily with a meal.   famotidine 20 MG tablet Commonly known as: PEPCID Take 20 mg by mouth 2 (two) times daily.   ferrous sulfate 324 MG Tbec 324 mg.   HumaLOG KwikPen 100 UNIT/ML KwikPen Generic  drug: insulin lispro INJECT 12 UNITS UNDER THE SKIN 3 TIMES DAILY WITH MEALS   insulin detemir 100 UNIT/ML FlexPen Commonly known as: LEVEMIR Inject 28 Units into the skin at bedtime.   losartan 25 MG tablet Commonly known as: COZAAR Take 25 mg by mouth daily.   sildenafil 20 MG tablet Commonly known as: REVATIO Take by mouth.               Discharge Care Instructions  (From admission, onward)           Start     Ordered   10/21/20 0000  Discharge wound care:       Comments: Per podiatry   10/21/20 1215  Follow-up Information     [place], Sarah T, MD Follow up in 1 week(s).   Specialty: Family Medicine Contact information: [address] [place] [id number] [phone]                  Time coordinating discharge: 35 min  Signed:  Joseph ArtJessica U Vinton Layson DO  [facility] 10/21/2020, 12:17 PM

## 2020-10-22 ENCOUNTER — Encounter (HOSPITAL_COMMUNITY): Payer: Self-pay | Admitting: Podiatry

## 2020-10-22 LAB — GLUCOSE, CAPILLARY: Glucose-Capillary: 95 mg/dL (ref 70–99)

## 2020-10-23 DIAGNOSIS — L089 Local infection of the skin and subcutaneous tissue, unspecified: Secondary | ICD-10-CM

## 2020-10-24 LAB — SURGICAL PATHOLOGY

## 2020-10-25 ENCOUNTER — Ambulatory Visit (INDEPENDENT_AMBULATORY_CARE_PROVIDER_SITE_OTHER): Payer: Medicaid Other | Admitting: Podiatry

## 2020-10-25 ENCOUNTER — Other Ambulatory Visit: Payer: Self-pay

## 2020-10-25 DIAGNOSIS — M86672 Other chronic osteomyelitis, left ankle and foot: Secondary | ICD-10-CM

## 2020-10-25 DIAGNOSIS — L97521 Non-pressure chronic ulcer of other part of left foot limited to breakdown of skin: Secondary | ICD-10-CM

## 2020-10-25 NOTE — Progress Notes (Signed)
  Subjective:  Patient ID: [name], male    DOB: Feb 04, 1971,  MRN: [id number]  No chief complaint on file.  DOS: 10/21/20 Procedure: left 2nd toe amputation   50 y.o. male presents with the above complaint. History confirmed with patient. Doing very well, denies pain; has kept dressing intact  Objective:  Physical Exam: tenderness at the surgical site, local edema noted, and calf supple, nontender. Incision: healing well, no significant drainage, no dehiscence, no significant erythema  Assessment:   1. Ulcer of left foot, limited to breakdown of skin (HCC)   2. Other chronic osteomyelitis of left ankle (HCC)     Plan:  Patient was evaluated and treated and all questions answered.  Post-operative State -Dressing applied consisting of sterile gauze, kerlix, and ACE bandage -WBAT in Surgical shoe -XRs needed at follow-up: none  No follow-ups on file.

## 2020-11-08 ENCOUNTER — Ambulatory Visit (INDEPENDENT_AMBULATORY_CARE_PROVIDER_SITE_OTHER): Payer: Self-pay | Admitting: Podiatry

## 2020-11-08 ENCOUNTER — Encounter: Payer: Self-pay | Admitting: Podiatry

## 2020-11-08 ENCOUNTER — Other Ambulatory Visit: Payer: Self-pay

## 2020-11-08 DIAGNOSIS — M86672 Other chronic osteomyelitis, left ankle and foot: Secondary | ICD-10-CM

## 2020-11-08 DIAGNOSIS — L97521 Non-pressure chronic ulcer of other part of left foot limited to breakdown of skin: Secondary | ICD-10-CM

## 2020-11-08 NOTE — Progress Notes (Signed)
  Subjective:  Patient ID: [name], male    DOB: 04/10/1970,  MRN: [id number]  Chief Complaint  Patient presents with   Routine Post Op    I am doing a lot better on the left foot and it is not red or swelling   DOS: 10/21/20 Procedure: left 2nd toe amputation   50 y.o. male presents with the above complaint. History confirmed with patient.   Objective:  Physical Exam: tenderness at the surgical site, local edema noted, and calf supple, nontender. Incision: Appears fully healed.  Assessment:   1. Ulcer of left foot, limited to breakdown of skin (HCC)   2. Other chronic osteomyelitis of left ankle (HCC)      Plan:  Patient was evaluated and treated and all questions answered.  Post-operative State -Sutures removed -Staples removed -Ok to start showering at this time. Advised they cannot soak. -WBAT in Surgical shoe -XRs needed at follow-up: none  Return in about 6 weeks (around 12/20/2020) for Amputation f/u.

## 2020-12-20 ENCOUNTER — Encounter: Payer: Self-pay | Admitting: Podiatry

## 2020-12-20 ENCOUNTER — Ambulatory Visit (INDEPENDENT_AMBULATORY_CARE_PROVIDER_SITE_OTHER): Payer: Self-pay | Admitting: Podiatry

## 2020-12-20 ENCOUNTER — Other Ambulatory Visit: Payer: Self-pay

## 2020-12-20 DIAGNOSIS — M86672 Other chronic osteomyelitis, left ankle and foot: Secondary | ICD-10-CM

## 2020-12-20 DIAGNOSIS — L97521 Non-pressure chronic ulcer of other part of left foot limited to breakdown of skin: Secondary | ICD-10-CM

## 2020-12-20 NOTE — Progress Notes (Signed)
  Subjective:  Patient ID: [name], male    DOB: [birth year],  MRN: [id number]  Chief Complaint  Patient presents with   Routine Post Op    I am doing really good and there is no pain and I am not hurting any on the left foot    DOS: 10/21/20 Procedure: left 2nd toe amputation   50 y.o. male presents with the above complaint. History confirmed with patient.   Objective:  Physical Exam: no tenderness at the surgical site, no edema noted, and calf supple, nontender. Incision: Appears fully healed.  Assessment:   1. Ulcer of left foot, limited to breakdown of skin (HCC)   2. Other chronic osteomyelitis of left ankle (HCC)    Plan:  Patient was evaluated and treated and all questions answered.  Post-operative State -All wounds remain well-healed.  He has some shoe gear irritation developing to the lateral aspect of the fifth toe.  Advised to monitor this.  He was given silicone toe cap to help prevent pressure.  He does have a slight rocker-bottom foot type from partial calcaneal resection but he has full range of motion at the ankle with good strength.  We discussed proper follow-up should any ulceration occur to prevent further issues at this point.  No follow-ups on file.

## 2021-02-11 ENCOUNTER — Other Ambulatory Visit: Payer: Self-pay

## 2021-02-11 ENCOUNTER — Ambulatory Visit: Payer: Self-pay

## 2021-02-11 ENCOUNTER — Ambulatory Visit (INDEPENDENT_AMBULATORY_CARE_PROVIDER_SITE_OTHER): Payer: Self-pay | Admitting: Podiatry

## 2021-02-11 DIAGNOSIS — L97521 Non-pressure chronic ulcer of other part of left foot limited to breakdown of skin: Secondary | ICD-10-CM

## 2021-02-11 MED ORDER — DOXYCYCLINE HYCLATE 100 MG PO TABS: 100.0000 mg | ORAL_TABLET | Freq: Two times a day (BID) | ORAL | 0 refills | Status: DC

## 2021-02-11 MED ORDER — SILVER SULFADIAZINE 1 % EX CREA: TOPICAL_CREAM | CUTANEOUS | 0 refills | Status: DC

## 2021-02-11 NOTE — Progress Notes (Signed)
  Subjective:  Patient ID: [name], male    DOB: 03/26/1971,  MRN: [id number]  No chief complaint on file.   50 y.o. male presents with the above complaint. History confirmed with patient. States he noticed a small wound on his left great toe that happened Thursday and he was worried about it.  Objective:  Physical Exam: warm, good capillary refill, no trophic changes or ulcerative lesions, normal DP and PT pulses, and normal sensory exam. Left Foot: superficial 0.4 cm abrasion dorsal hallux IPJ with local redness likely irritation. No warmth, no deep probing. No drainage. Left 5th toe with chronic discoloration from irritation.     Assessment:   1. Ulcer of left foot, limited to breakdown of skin [facility])    Plan:  Patient was evaluated and treated and all questions answered.  Ulcer/abrasion left hallux -Appears superficial, no XR needed today -Scant redness more likely irritation as it is not warm, but will send abx out of abundance of precaution -Dispensed silicone toe caps left hallux and left 5th toe  Return in about 3 weeks (around 03/04/2021) for Wound Care.

## 2021-03-04 ENCOUNTER — Ambulatory Visit: Payer: Self-pay | Admitting: Podiatry

## 2021-03-12 DIAGNOSIS — M79676 Pain in unspecified toe(s): Secondary | ICD-10-CM

## 2021-04-03 ENCOUNTER — Telehealth: Payer: Self-pay | Admitting: Podiatry

## 2021-04-03 MED ORDER — DOXYCYCLINE HYCLATE 100 MG PO TABS: 100.0000 mg | ORAL_TABLET | Freq: Two times a day (BID) | ORAL | 0 refills | Status: DC

## 2021-04-03 NOTE — Telephone Encounter (Signed)
Ph[facility] :  [facility] in [place]  Patient wife called patients great toe on left foot is red and swollen, she thinks he needs an antibiotic.  She thinks the infection has spread from the surgery site to his big toe.

## 2021-04-04 ENCOUNTER — Ambulatory Visit: Payer: Medicaid Other | Admitting: Podiatry

## 2021-04-04 NOTE — Telephone Encounter (Signed)
Patient wife stated that [name] has open wound and that it has an odor/smells rotten.  Wife would like to know what he should do since he was suppose to be seen today.  Please call wife back asap

## 2021-04-04 NOTE — Telephone Encounter (Signed)
Called and spoke with the patient's wife and relayed the message per Dr Samuella Cota. Misty Stanley

## 2021-04-04 NOTE — Telephone Encounter (Signed)
Misty Stanley,  Advise him to keep it clean, and he can apply betadine to the wound. If we can't get him in he can go to urgent care or ED for eval. Continue taking the abx that was ordered  Marchelle Folks,   Any chance he could be seen in [place] today?

## 2021-05-26 ENCOUNTER — Inpatient Hospital Stay (HOSPITAL_COMMUNITY)
Admission: AD | Admit: 2021-05-26 | Discharge: 2021-05-30 | DRG: 617 | Disposition: A | Discharge status: Home Health | Payer: Medicaid Other | Source: Other Acute Inpatient Hospital | Attending: Internal Medicine | Admitting: Internal Medicine

## 2021-05-26 ENCOUNTER — Inpatient Hospital Stay (HOSPITAL_COMMUNITY): Payer: Medicaid Other

## 2021-05-26 ENCOUNTER — Other Ambulatory Visit: Payer: Self-pay | Admitting: Podiatry

## 2021-05-26 DIAGNOSIS — E1169 Type 2 diabetes mellitus with other specified complication: Principal | ICD-10-CM | POA: Diagnosis present

## 2021-05-26 DIAGNOSIS — M869 Osteomyelitis, unspecified: Secondary | ICD-10-CM | POA: Diagnosis not present

## 2021-05-26 DIAGNOSIS — M25511 Pain in right shoulder: Secondary | ICD-10-CM | POA: Diagnosis present

## 2021-05-26 DIAGNOSIS — Z888 Allergy status to other drugs, medicaments and biological substances status: Secondary | ICD-10-CM

## 2021-05-26 DIAGNOSIS — I160 Hypertensive urgency: Secondary | ICD-10-CM | POA: Diagnosis not present

## 2021-05-26 DIAGNOSIS — Z6820 Body mass index (BMI) 20.0-20.9, adult: Secondary | ICD-10-CM | POA: Diagnosis not present

## 2021-05-26 DIAGNOSIS — D649 Anemia, unspecified: Secondary | ICD-10-CM | POA: Diagnosis present

## 2021-05-26 DIAGNOSIS — Z833 Family history of diabetes mellitus: Secondary | ICD-10-CM

## 2021-05-26 DIAGNOSIS — B9561 Methicillin susceptible Staphylococcus aureus infection as the cause of diseases classified elsewhere: Secondary | ICD-10-CM | POA: Diagnosis present

## 2021-05-26 DIAGNOSIS — E1165 Type 2 diabetes mellitus with hyperglycemia: Secondary | ICD-10-CM | POA: Diagnosis present

## 2021-05-26 DIAGNOSIS — I10 Essential (primary) hypertension: Secondary | ICD-10-CM | POA: Diagnosis present

## 2021-05-26 DIAGNOSIS — Z20822 Contact with and (suspected) exposure to covid-19: Secondary | ICD-10-CM | POA: Diagnosis present

## 2021-05-26 DIAGNOSIS — Z87891 Personal history of nicotine dependence: Secondary | ICD-10-CM | POA: Diagnosis not present

## 2021-05-26 DIAGNOSIS — M86172 Other acute osteomyelitis, left ankle and foot: Secondary | ICD-10-CM | POA: Diagnosis present

## 2021-05-26 DIAGNOSIS — D510 Vitamin B12 deficiency anemia due to intrinsic factor deficiency: Secondary | ICD-10-CM | POA: Diagnosis present

## 2021-05-26 DIAGNOSIS — L02612 Cutaneous abscess of left foot: Secondary | ICD-10-CM | POA: Diagnosis present

## 2021-05-26 DIAGNOSIS — M00072 Staphylococcal arthritis, left ankle and foot: Secondary | ICD-10-CM | POA: Diagnosis present

## 2021-05-26 DIAGNOSIS — M8610 Other acute osteomyelitis, unspecified site: Secondary | ICD-10-CM | POA: Diagnosis present

## 2021-05-26 DIAGNOSIS — M8660 Other chronic osteomyelitis, unspecified site: Secondary | ICD-10-CM | POA: Diagnosis not present

## 2021-05-26 DIAGNOSIS — Z880 Allergy status to penicillin: Secondary | ICD-10-CM | POA: Diagnosis not present

## 2021-05-26 DIAGNOSIS — E119 Type 2 diabetes mellitus without complications: Secondary | ICD-10-CM

## 2021-05-26 DIAGNOSIS — E11621 Type 2 diabetes mellitus with foot ulcer: Secondary | ICD-10-CM | POA: Diagnosis present

## 2021-05-26 DIAGNOSIS — L97426 Non-pressure chronic ulcer of left heel and midfoot with bone involvement without evidence of necrosis: Secondary | ICD-10-CM | POA: Diagnosis present

## 2021-05-26 DIAGNOSIS — A4101 Sepsis due to Methicillin susceptible Staphylococcus aureus: Secondary | ICD-10-CM

## 2021-05-26 DIAGNOSIS — Z794 Long term (current) use of insulin: Secondary | ICD-10-CM

## 2021-05-26 DIAGNOSIS — M86672 Other chronic osteomyelitis, left ankle and foot: Secondary | ICD-10-CM | POA: Diagnosis present

## 2021-05-26 DIAGNOSIS — R7881 Bacteremia: Secondary | ICD-10-CM | POA: Diagnosis present

## 2021-05-26 DIAGNOSIS — E785 Hyperlipidemia, unspecified: Secondary | ICD-10-CM | POA: Diagnosis present

## 2021-05-26 DIAGNOSIS — R0600 Dyspnea, unspecified: Secondary | ICD-10-CM

## 2021-05-26 HISTORY — DX: Essential (primary) hypertension: I10

## 2021-05-26 LAB — COMPREHENSIVE METABOLIC PANEL
ALT: 17 U/L (ref 0–44)
AST: 20 U/L (ref 15–41)
Albumin: 2.4 g/dL — ABNORMAL LOW (ref 3.5–5.0)
Alkaline Phosphatase: 110 U/L (ref 38–126)
Anion gap: 15 (ref 5–15)
BUN: 13 mg/dL (ref 6–20)
CO2: 22 mmol/L (ref 22–32)
Calcium: 8.5 mg/dL — ABNORMAL LOW (ref 8.9–10.3)
Chloride: 93 mmol/L — ABNORMAL LOW (ref 98–111)
Creatinine, Ser: 0.85 mg/dL (ref 0.61–1.24)
GFR, Estimated: >60 mL/min (ref >60)
Glucose, Bld: 394 mg/dL — ABNORMAL HIGH (ref 70–99)
Potassium: 4.3 mmol/L (ref 3.5–5.1)
Sodium: 130 mmol/L — ABNORMAL LOW (ref 135–145)
Total Bilirubin: 0.6 mg/dL (ref 0.3–1.2)
Total Protein: 6.9 g/dL (ref 6.5–8.1)

## 2021-05-26 LAB — GLUCOSE, CAPILLARY
Glucose-Capillary: 326 mg/dL — ABNORMAL HIGH (ref 70–99)
Glucose-Capillary: 358 mg/dL — ABNORMAL HIGH (ref 70–99)
Glucose-Capillary: 336 mg/dL — ABNORMAL HIGH (ref 70–99)

## 2021-05-26 LAB — HEMOGLOBIN A1C
Hgb A1c MFr Bld: 9 % — ABNORMAL HIGH (ref 4.8–5.6)
Mean Plasma Glucose: 211.6 mg/dL

## 2021-05-26 LAB — PROTIME-INR
INR: 1.2 (ref 0.8–1.2)
Prothrombin Time: 15.6 seconds — ABNORMAL HIGH (ref 11.4–15.2)

## 2021-05-26 MED ORDER — LOSARTAN POTASSIUM 50 MG PO TABS
25.0000 mg | ORAL_TABLET | Freq: Every day | ORAL | Status: DC
  Administered 2021-05-26 – 2021-05-30 (×5): 25 mg via ORAL
  Filled 2021-05-26 (×5): qty 1

## 2021-05-26 MED ORDER — FERROUS SULFATE 325 (65 FE) MG PO TABS
324.0000 mg | ORAL_TABLET | Freq: Every day | ORAL | Status: DC
  Administered 2021-05-27 – 2021-05-30 (×3): 324 mg via ORAL
  Filled 2021-05-26 (×3): qty 1

## 2021-05-26 MED ORDER — VANCOMYCIN HCL 1500 MG/300ML IV SOLN
1500.0000 mg | Freq: Two times a day (BID) | INTRAVENOUS | Status: DC
  Filled 2021-05-26: qty 300

## 2021-05-26 MED ORDER — VANCOMYCIN HCL 1750 MG/350ML IV SOLN
1750.0000 mg | Freq: Once | INTRAVENOUS | Status: AC
  Administered 2021-05-26: 1750 mg via INTRAVENOUS
  Filled 2021-05-26: qty 350

## 2021-05-26 MED ORDER — SODIUM CHLORIDE 0.9% FLUSH
3.0000 mL | Freq: Two times a day (BID) | INTRAVENOUS | Status: DC
  Administered 2021-05-26 – 2021-05-30 (×5): 3 mL via INTRAVENOUS

## 2021-05-26 MED ORDER — ONDANSETRON HCL 4 MG/2ML IJ SOLN
4.0000 mg | Freq: Four times a day (QID) | INTRAMUSCULAR | Status: DC | PRN
  Administered 2021-05-28: 4 mg via INTRAVENOUS

## 2021-05-26 MED ORDER — SODIUM CHLORIDE 0.9 % IV SOLN
2.0000 g | Freq: Three times a day (TID) | INTRAVENOUS | Status: DC
  Administered 2021-05-26 – 2021-05-28 (×5): 2 g via INTRAVENOUS
  Filled 2021-05-26 (×5): qty 2

## 2021-05-26 MED ORDER — OXYCODONE HCL 5 MG PO TABS: 5.0000 mg | ORAL_TABLET | ORAL | Status: DC | PRN

## 2021-05-26 MED ORDER — ACETAMINOPHEN 650 MG RE SUPP: 650.0000 mg | Freq: Four times a day (QID) | RECTAL | Status: DC | PRN

## 2021-05-26 MED ORDER — INSULIN ASPART 100 UNIT/ML IJ SOLN
0.0000 [IU] | Freq: Three times a day (TID) | INTRAMUSCULAR | Status: DC
  Administered 2021-05-26: 7 [IU] via SUBCUTANEOUS
  Administered 2021-05-27: 3 [IU] via SUBCUTANEOUS

## 2021-05-26 MED ORDER — METRONIDAZOLE 500 MG/100ML IV SOLN
500.0000 mg | Freq: Two times a day (BID) | INTRAVENOUS | Status: DC
  Administered 2021-05-26 – 2021-05-28 (×4): 500 mg via INTRAVENOUS
  Filled 2021-05-26 (×4): qty 100

## 2021-05-26 MED ORDER — SODIUM CHLORIDE 0.9% FLUSH
3.0000 mL | INTRAVENOUS | Status: DC | PRN
Start: 2021-05-26 — End: 2021-05-30

## 2021-05-26 MED ORDER — INSULIN ASPART 100 UNIT/ML IJ SOLN: 0.0000 [IU] | Freq: Three times a day (TID) | INTRAMUSCULAR | Status: DC

## 2021-05-26 MED ORDER — MELATONIN 5 MG PO TABS
5.0000 mg | ORAL_TABLET | Freq: Every day | ORAL | Status: DC
  Administered 2021-05-26 – 2021-05-29 (×4): 5 mg via ORAL
  Filled 2021-05-26 (×4): qty 1

## 2021-05-26 MED ORDER — VITAMIN B-12 1000 MCG PO TABS
1000.0000 ug | ORAL_TABLET | Freq: Every day | ORAL | Status: DC
  Administered 2021-05-27 – 2021-05-30 (×4): 1000 ug via ORAL
  Filled 2021-05-26 (×4): qty 1

## 2021-05-26 MED ORDER — ONDANSETRON HCL 4 MG PO TABS: 4.0000 mg | ORAL_TABLET | Freq: Four times a day (QID) | ORAL | Status: DC | PRN

## 2021-05-26 MED ORDER — SODIUM CHLORIDE 0.9 % IV SOLN
250.0000 mL | INTRAVENOUS | Status: DC | PRN
Start: 2021-05-26 — End: 2021-05-30

## 2021-05-26 MED ORDER — ACETAMINOPHEN 325 MG PO TABS
650.0000 mg | ORAL_TABLET | Freq: Four times a day (QID) | ORAL | Status: DC | PRN
  Administered 2021-05-27 – 2021-05-28 (×2): 650 mg via ORAL
  Filled 2021-05-26 (×2): qty 2

## 2021-05-26 MED ORDER — DICLOFENAC SODIUM 1 % EX GEL
4.0000 g | Freq: Four times a day (QID) | CUTANEOUS | Status: DC
  Administered 2021-05-26 – 2021-05-30 (×15): 4 g via TOPICAL
  Filled 2021-05-26: qty 100

## 2021-05-26 MED ORDER — INSULIN DETEMIR 100 UNIT/ML ~~LOC~~ SOLN
28.0000 [IU] | Freq: Every day | SUBCUTANEOUS | Status: DC
  Administered 2021-05-26: 28 [IU] via SUBCUTANEOUS
  Filled 2021-05-26 (×2): qty 0.28

## 2021-05-26 NOTE — Progress Notes (Addendum)
Pharmacy Antibiotic Note  [name] is a 51 y.o. male admitted on 05/26/2021 with bacteremia and osteomyelitis.  Pharmacy has been consulted for vancomycin and cefepime dosing (he is also noted on flagyl). SCr= 0.85  Plan: -Cefepime 2gm IV q8h -vancomycin 1750mg  IV x1 followed by 1500mg  IV q12h (estimated AUC= 520 using SCr of 0.85) -Vancomycin maintenance dose based on SCr -Will follow renal function, cultures and clinical progress   Height: 6\' 3"  (190.5 cm) Weight: 76 kg (167 lb 8.8 oz) IBW/kg (Calculated) : 84.5  Temp (24hrs), Avg:98.6 F (37 C), Min:98.6 F (37 C), Max:98.6 F (37 C)  No results for input(s): WBC, CREATININE, LATICACIDVEN, VANCOTROUGH, VANCOPEAK, VANCORANDOM, GENTTROUGH, GENTPEAK, GENTRANDOM, TOBRATROUGH, TOBRAPEAK, TOBRARND, AMIKACINPEAK, AMIKACINTROU, AMIKACIN in the last 168 hours.  CrCl cannot be calculated (Patient's most recent lab result is older than the maximum 21 days allowed.).    Allergies  Allergen Reactions   Piperacillin Hives   Tazobactam Hives    Antimicrobials this admission: 2/19 vanc 2/19 cefepime 2/19 flagyl  Dose adjustments this admission:   Microbiology results:  Thank you for allowing pharmacy to be a part of this patients care.  3/19, PharmD Clinical Pharmacist **Pharmacist phone directory can now be found on [email or web address] ([email or web address]).  Listed under [facility].

## 2021-05-26 NOTE — Assessment & Plan Note (Addendum)
Continue B12 supplements.  Hemoglobin is stable.

## 2021-05-26 NOTE — Assessment & Plan Note (Addendum)
Apparently twisted his arm a week or so ago with now limited range of motion of the right shoulder with pain.  Discussed with patient.  Will need for his acute issues to subside before any evaluation for the shoulder can be performed.  He did undergo plain films apparently in [place] which were unremarkable.  Pain control for now.  PT evaluation.  Further work-up in the outpatient setting.

## 2021-05-26 NOTE — H&P (Signed)
History and Physical    Patient: [name] YYT:[id number] DOB: 02-Dec-1970 DOA: 05/26/2021 DOS: the patient was seen and examined on 05/26/2021 PCP: [place], Sarah T, MD  Patient coming from: Outside Hospital - homeless.    Chief Complaint: abscess/osteo of left calcaneus   HPI: [name] is a 51 y.o. male with medical history significant of TDM, HTN, who presented to [facility] as a transfer from [place]. He was evaluated in ED on 05/26/21. Sugar was 580 on arrival, but no DKA. Received IVF. Also had fever to 103. Abscess on left calcaneal area was incised and drained with approximately 300cc of pus drainage. Culture and gram stain obtained. Blood culture showed gram positive cocci-staph aureus detected by pcr.   He states he had surgery on his left heel in 2021. He states after surgery he had no issues. He states about 1-2 days ago he pushed on his left heel and it felt "mushy." And was mildly red. He knew something wasn't right so he went to ED. He has no pain in his foot, no drainage or opening.   He has had no fever/chills, no chest pain or palpitations, no shortness of breath or cough, no abdominal pain, mild nausea, no V/D, no dysuria, no leg swelling.   He does not smoke, drink alcohol or do drugs.   He has had right shoulder pain x 1 week after he twisted it off a rail. Was seen in ED for this with negative imaging. He hurts with movement. He can raise slightly above head. He has pain with all movement. No rotator cuff injuries in the past that he is aware of.    ER Course:  transferred from [facility] for calcaneous abcess, acute osteo of calceaneus. Vital stable, with fever to 103. Hyperglycemic, but not DKA.  Abx: vancomycin and aztreonam and flagyl was also added He is podiatry patient of Dr. March Rummage. EDP spoke with Dr. Posey Pronto of podiatry who recommended to come here, anticipate BKA.   Vitals on arrival: afebrile, bp: 129/90, HR; 84, RR: 16 and oxygen 98% on RA  Review of  Systems: As mentioned in the history of present illness. All other systems reviewed and are negative. Past Medical History:  Diagnosis Date   Diabetes mellitus without complication ([place])    Kidney disease    Past Surgical History:  Procedure Laterality Date   AMPUTATION TOE Left 10/21/2020   Procedure: LEFT SECOND TOE AMPUTATION;  Surgeon: Evelina Bucy, DPM;  Location: [facility];  Service: Podiatry;  Laterality: Left;   mastoid surgery     Social History:  reports that he has quit smoking. He has never used smokeless tobacco. He reports that he does not drink alcohol and does not use drugs.  Allergies  Allergen Reactions   Piperacillin Hives   Tazobactam Hives    Family History  Problem Relation Age of Onset   Diabetes Mellitus II Sister     Prior to Admission medications   Medication Sig Start Date End Date Taking? Authorizing Provider  BAQSIMI TWO PACK 3 MG/DOSE POWD Place into both nostrils. 06/29/20   [provider]  calcium carbonate (OSCAL) 1500 (600 Ca) MG TABS tablet Take by mouth 2 (two) times daily with a meal.    [provider]  doxycycline (VIBRA-TABS) 100 MG tablet Take 1 tablet (100 mg total) by mouth 2 (two) times daily. 04/03/21   Evelina Bucy, DPM  famotidine (PEPCID) 20 MG tablet Take 20 mg by mouth 2 (two) times  daily. 08/07/20   [provider]  ferrous sulfate 324 MG TBEC 324 mg. 05/16/20   [provider]  HUMALOG KWIKPEN 100 UNIT/ML KwikPen INJECT 12 UNITS UNDER THE SKIN 3 TIMES DAILY WITH MEALS 07/11/20   [provider]  insulin detemir (LEVEMIR) 100 UNIT/ML FlexPen Inject 28 Units into the skin at bedtime. 05/09/19   [provider]  Insulin Glargine (BASAGLAR KWIKPEN) 100 UNIT/ML INJECT 32 UNITS UNDER THE SKIN NIGHTLY (START ONLY AFTER RUNNING OUT OF LEVEMIR) 07/11/20   [provider]  losartan (COZAAR) 25 MG tablet Take 25 mg by mouth daily. 06/17/20   [provider]  nystatin cream  (MYCOSTATIN) Apply topically 3 (three) times daily. 06/04/20   [provider]  sildenafil (REVATIO) 20 MG tablet Take by mouth. 10/09/20   [provider]  silver sulfADIAZINE (SILVADENE) 1 % cream Apply pea-sized amount to wound daily. 02/11/21   Evelina Bucy, DPM    Physical Exam: Vitals:   05/26/21 1722 05/26/21 2001  BP: 129/90 130/81  Pulse: 84 86  Resp: 16 16  Temp: 98.6 F (37 C) 98.5 F (36.9 C)  TempSrc: Oral Oral  SpO2: 98% 97%  Weight: 76 kg   Height: $Remove'6\' 3"'mKyGCUS$  (1.905 m)    General:  Appears calm and comfortable and is in NAD. Thin and pale  Eyes:  PERRL, EOMI, normal lids, iris ENT:  grossly normal hearing, lips & tongue, mmm; missing teeth  Neck:  no LAD, masses or thyromegaly; no carotid bruits Cardiovascular:  RRR, no m/r/g. No LE edema.  Respiratory:   CTA bilaterally with no wheezes/rales/rhonchi.  Normal respiratory effort. Abdomen:  soft, NT, ND, NABS Back:   normal alignment, no CVAT Skin:  no rash or induration seen on limited exam   Musculoskeletal:  grossly normal tone BUE/BLE, good ROM, no bony abnormality right shoulder exam: +passive arc, negative gerber, +neers test.  Lower extremity:  No LE edema.  Limited foot exam. Left foot with medial calcaneal abscess that is draining purulent/sanguinous material.  2+ distal pulses. Psychiatric:  grossly normal mood and affect, speech fluent and appropriate, AOx3 Neurologic:  CN 2-12 grossly intact, moves all extremities in coordinated fashion, sensation intact   Radiological Exams on Admission: Independently reviewed - see discussion in A/P where applicable  No results found.  EKG: baseline pending   Labs on Admission: I have personally reviewed the available labs and imaging studies at the time of the admission.  Pertinent labs:   Wbc: 13.1 Hgb: 13 CRP 268, ESR: 100 Sugar: came down to 140-200 Covid/flu: negative Vbg pH: 7.43  Left foot xray: marked severityh of soft tissue  swelling involving the left heel with extensive cortical destruction of the left calcaneous. Increased in severity. Extensive osteomyelitis of left calcaneous. Part chronic in nature, an acute component can not be excluded. MRI recommended.     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  -admit to med surg - lactic acid was wnl at [place], ESR and CRP significantly elevated originally met sepsis criteria with fever/elevated WBC. Fever has resolved.  -ID called, will do vancomycin, cefepime and flagyl -MRI left foot ordered -may need ABI, looks like ordered in 06/2020, but can not see results. He states he had it done.  -left message on Dr. Posey Pronto (  podiatry) phone to let them know patient has arrived.   Bacteremia Per records from [place] had blood culture with  +staph aureus in setting of left foot abscess and possible acute on chronic osteo -initially with sepsis criteria with elevated WBC and fever, fever resolved -received vancomycin, aztreonam, and flagyl at [facility]  -ID consulted, on vancomycin -repeat blood cultures ordered   Right shoulder pain Exam with some findings consistent with rotator cuff pathology Could MRI outpatient for further work up voltaren gel QID Tylenol and oxycodone for severe pain    Insulin-requiring or dependent type II diabetes mellitus ([place]) Poorly controlled, states he is compliant with insulin.  a1c over a year ago was 9.2, repeat pending Continue home levemir at 28 units, SSI and accuchecks per protocol   Essential hypertension- (present on admission) Appears controlled  Continue losartan daily   Pernicious anemia- (present on admission) Continue B12 supplements     Advance Care Planning:   Code Status: Full Code    Consults: ID: Dr. Juleen China, podiatry: Dr. Posey Pronto left VM, SW for homelessness   DVT Prophylaxis: SCD  Family Communication: none   Severity of Illness: The appropriate patient status for this patient is INPATIENT. Inpatient status is judged to be reasonable and necessary in order to provide the required intensity of service to ensure the patient's safety. The patient's presenting symptoms, physical exam findings, and initial radiographic and laboratory data in the context of their chronic comorbidities is felt to place them at high risk for further clinical deterioration. Furthermore, it is not anticipated that the patient will be medically stable for discharge from the hospital within 2 midnights of admission.   * I certify that at the point of admission it is my clinical judgment that the patient will require inpatient hospital care spanning beyond 2 midnights from the point of admission due to high intensity of service, high risk for further deterioration and high frequency of surveillance required.*  Author: Orma Flaming, MD 05/26/2021 8:12 PM  For on call review [email or web address].

## 2021-05-26 NOTE — Assessment & Plan Note (Addendum)
Stable.  Continue losartan. 

## 2021-05-26 NOTE — Assessment & Plan Note (Addendum)
51 year old presenting to [place] ED with left heel abscess and findings concerning for acute on chronic osteomyelitis in setting of poorly controlled diabetes.  He underwent MRI of his foot which showed raised concern for acute osteomyelitis of the calcaneum.  Findings suggestive of cellulitis also noted.  Small tibiotalar and posterior subtalar joint effusions noted raising concern for septic arthritis.  Diffuse edema noted throughout the intrinsic foot musculature compatible with nonspecific myositis. Podiatry was consulted.  Podiatry recommended evaluation by orthopedics.  Discussed with Dr. Sharol Given.  Plan is for transtibial amputation today. Patient was initially placed on vancomycin cefepime and metronidazole.  Infectious disease was consulted.  Vancomycin was discontinued.  Further plans per ID.

## 2021-05-26 NOTE — Assessment & Plan Note (Addendum)
HbA1c is 9.0.  Patient remains on Levemir and SSI.  CBGs remain poorly controlled.  Will need to adjust the dose of Levemir after his surgery.

## 2021-05-26 NOTE — Assessment & Plan Note (Addendum)
Per records from [place] had blood culture with  +staph aureus in setting of left foot abscess and possible acute on chronic osteo. Initially placed on vancomycin cefepime and metronidazole. Infectious diseases following.  Echocardiogram did not show any acute findings.  ID recommending TEE.  Will involve cardiology to arrange this.

## 2021-05-27 ENCOUNTER — Encounter (HOSPITAL_COMMUNITY): Payer: Self-pay | Admitting: Family Medicine

## 2021-05-27 ENCOUNTER — Other Ambulatory Visit: Payer: Self-pay

## 2021-05-27 ENCOUNTER — Inpatient Hospital Stay (HOSPITAL_COMMUNITY): Payer: Medicaid Other

## 2021-05-27 DIAGNOSIS — E1169 Type 2 diabetes mellitus with other specified complication: Principal | ICD-10-CM

## 2021-05-27 DIAGNOSIS — M25511 Pain in right shoulder: Secondary | ICD-10-CM

## 2021-05-27 DIAGNOSIS — M86172 Other acute osteomyelitis, left ankle and foot: Secondary | ICD-10-CM | POA: Diagnosis not present

## 2021-05-27 DIAGNOSIS — I160 Hypertensive urgency: Secondary | ICD-10-CM

## 2021-05-27 DIAGNOSIS — B9561 Methicillin susceptible Staphylococcus aureus infection as the cause of diseases classified elsewhere: Secondary | ICD-10-CM

## 2021-05-27 DIAGNOSIS — R7881 Bacteremia: Secondary | ICD-10-CM

## 2021-05-27 DIAGNOSIS — M8660 Other chronic osteomyelitis, unspecified site: Secondary | ICD-10-CM

## 2021-05-27 DIAGNOSIS — M8610 Other acute osteomyelitis, unspecified site: Secondary | ICD-10-CM

## 2021-05-27 DIAGNOSIS — I10 Essential (primary) hypertension: Secondary | ICD-10-CM

## 2021-05-27 DIAGNOSIS — A4101 Sepsis due to Methicillin susceptible Staphylococcus aureus: Secondary | ICD-10-CM

## 2021-05-27 DIAGNOSIS — Z794 Long term (current) use of insulin: Secondary | ICD-10-CM

## 2021-05-27 LAB — BASIC METABOLIC PANEL
Anion gap: 15 (ref 5–15)
BUN: 14 mg/dL (ref 6–20)
CO2: 22 mmol/L (ref 22–32)
Calcium: 8.5 mg/dL — ABNORMAL LOW (ref 8.9–10.3)
Chloride: 96 mmol/L — ABNORMAL LOW (ref 98–111)
Creatinine, Ser: 0.89 mg/dL (ref 0.61–1.24)
GFR, Estimated: >60 mL/min (ref >60)
Glucose, Bld: 324 mg/dL — ABNORMAL HIGH (ref 70–99)
Potassium: 4.2 mmol/L (ref 3.5–5.1)
Sodium: 133 mmol/L — ABNORMAL LOW (ref 135–145)

## 2021-05-27 LAB — ECHOCARDIOGRAM COMPLETE
Area-P 1/2: 4.17 cm2
Calc EF: 55.8 %
Height: 75 in
S' Lateral: 3.7 cm
Single Plane A2C EF: 55.5 %
Single Plane A4C EF: 54.2 %
Weight: 2680.79 oz

## 2021-05-27 LAB — CBC
HCT: 34.4 % — ABNORMAL LOW (ref 39.0–52.0)
Hemoglobin: 11.5 g/dL — ABNORMAL LOW (ref 13.0–17.0)
MCH: 29.6 pg (ref 26.0–34.0)
MCHC: 33.4 g/dL (ref 30.0–36.0)
MCV: 88.4 fL (ref 80.0–100.0)
Platelets: 400 10*3/uL (ref 150–400)
RBC: 3.89 MIL/uL — ABNORMAL LOW (ref 4.22–5.81)
RDW: 11.4 % — ABNORMAL LOW (ref 11.5–15.5)
WBC: 10.7 10*3/uL — ABNORMAL HIGH (ref 4.0–10.5)
nRBC: 0 % (ref 0.0–0.2)

## 2021-05-27 LAB — GLUCOSE, CAPILLARY
Glucose-Capillary: 305 mg/dL — ABNORMAL HIGH (ref 70–99)
Glucose-Capillary: 241 mg/dL — ABNORMAL HIGH (ref 70–99)
Glucose-Capillary: 241 mg/dL — ABNORMAL HIGH (ref 70–99)
Glucose-Capillary: 279 mg/dL — ABNORMAL HIGH (ref 70–99)
Glucose-Capillary: 265 mg/dL — ABNORMAL HIGH (ref 70–99)
Glucose-Capillary: 118 mg/dL — ABNORMAL HIGH (ref 70–99)
Glucose-Capillary: 244 mg/dL — ABNORMAL HIGH (ref 70–99)

## 2021-05-27 MED ORDER — INSULIN ASPART 100 UNIT/ML IJ SOLN
0.0000 [IU] | Freq: Every day | INTRAMUSCULAR | Status: DC
  Administered 2021-05-27: 23:00:00 2 [IU] via SUBCUTANEOUS
  Administered 2021-05-28 – 2021-05-29 (×2): 3 [IU] via SUBCUTANEOUS

## 2021-05-27 MED ORDER — INSULIN DETEMIR 100 UNIT/ML ~~LOC~~ SOLN
14.0000 [IU] | Freq: Every day | SUBCUTANEOUS | Status: DC
  Administered 2021-05-27: 14 [IU] via SUBCUTANEOUS
  Filled 2021-05-27 (×2): qty 0.14

## 2021-05-27 MED ORDER — INSULIN ASPART 100 UNIT/ML IJ SOLN
0.0000 [IU] | Freq: Three times a day (TID) | INTRAMUSCULAR | Status: DC
  Administered 2021-05-27: 12:00:00 11 [IU] via SUBCUTANEOUS
  Administered 2021-05-28: 7 [IU] via SUBCUTANEOUS
  Administered 2021-05-28: 4 [IU] via SUBCUTANEOUS
  Administered 2021-05-28: 7 [IU] via SUBCUTANEOUS
  Administered 2021-05-29: 11 [IU] via SUBCUTANEOUS
  Administered 2021-05-29 (×2): 4 [IU] via SUBCUTANEOUS
  Administered 2021-05-30: 7 [IU] via SUBCUTANEOUS
  Administered 2021-05-30: 4 [IU] via SUBCUTANEOUS

## 2021-05-27 MED ORDER — ENSURE PRE-SURGERY PO LIQD
296.0000 mL | Freq: Once | ORAL | Status: AC
  Administered 2021-05-28: 296 mL via ORAL
  Filled 2021-05-27: qty 296

## 2021-05-27 MED ORDER — INSULIN GLARGINE-YFGN 100 UNIT/ML ~~LOC~~ SOLN
10.0000 [IU] | Freq: Once | SUBCUTANEOUS | Status: AC
  Administered 2021-05-27: 10 [IU] via SUBCUTANEOUS
  Filled 2021-05-27: qty 0.1

## 2021-05-27 NOTE — Hospital Course (Addendum)
51 y.o. male with medical history significant of TDM, HTN, who presented to [facility] as a transfer from [facility]. He was evaluated in ED on 05/26/21. Sugar was 580 on arrival, but no DKA. Received IVF. Also had fever to 103. Abscess on left calcaneal area was incised and drained with approximately 300cc of pus drainage. Culture and gram stain obtained. Blood culture showed gram positive cocci-staph aureus detected by pcr. He states he had surgery on his left heel in 2021. He states after surgery he had no issues. He states about 1-2 days prior to admission he pushed on his left heel and it felt "mushy." And was mildly red.  He presented initially to [facility].  Transferred to [facility].  MRI foot showed evidence for septic joint and osteomyelitis.  Seen by podiatry and orthopedics.  Plan is for transtibial amputation today.

## 2021-05-27 NOTE — Progress Notes (Signed)
ABI has been completed. ° °Results can be found under chart review under CV PROC. °05/27/2021 4:05 PM °Amaurie Schreckengost RVT, RDMS ° °

## 2021-05-27 NOTE — Consult Note (Signed)
Consult Note  Subjective: [name] is a 52 y.o. male with medical history significant of TDM, HTN, who presented to [facility] as a transfer from [place] [facility]. He was evaluated in ED on 05/26/21. Sugar was 580 on arrival, but no DKA. Received IVF. Also had fever to 103. Abscess on left calcaneal area was incised and drained with approximately 300cc of pus drainage. Culture and gram stain obtained. Blood culture showed gram positive cocci-staph aureus detected by pcr.   Patient is a previous history of partial calcanectomy performed by [name].   Currently states he is feeling well without any fevers or chills.  MRI was completed this morning.  Objective: AAO x3, NAD DP/PT pulses palpable bilaterally, CRT less than 3 seconds Ulceration noted to the posterior aspect of the heel.  There is still purulent drainage coming from the wound.  There is edema and erythema to the heel. No pain with calf compression       Assessment: Osteomyelitis calcaneus  Plan: White blood cell count slightly elevated 10.7.  Currently afebrile.  MRI was reviewed with the patient which did show osteomyelitis in the calcaneus and there is also concern for septic ankle, subtalar joint.  Given the infection in his heel I have recommended a below-knee amputation.  First the patient is asking about any other options but I do not feel that this is advisable for him.  I recommend orthopedics consultation for below-knee amputation.   [name] did see the patient and scheduled for below-knee amputation tomorrow.  Podiatry will sign off.  [name], DPM

## 2021-05-27 NOTE — Anesthesia Preprocedure Evaluation (Addendum)
Anesthesia Evaluation  Patient identified by MRN, date of birth, ID band Patient awake    Reviewed: Allergy & Precautions, Patient's Chart, lab work & pertinent test results  Airway Mallampati: II  TM Distance: >3 FB     Dental   Pulmonary neg pulmonary ROS, former smoker,    breath sounds clear to auscultation       Cardiovascular hypertension,  Rhythm:Regular Rate:Normal     Neuro/Psych    GI/Hepatic negative GI ROS, Neg liver ROS,   Endo/Other  diabetes  Renal/GU negative Renal ROS     Musculoskeletal   Abdominal   Peds  Hematology   Anesthesia Other Findings   Reproductive/Obstetrics                            Anesthesia Physical Anesthesia Plan  ASA: 3  Anesthesia Plan: General   Post-op Pain Management:    Induction: Intravenous  PONV Risk Score and Plan: 3 and Ondansetron, Dexamethasone and Midazolam  Airway Management Planned:   Additional Equipment:   Intra-op Plan:   Post-operative Plan: Extubation in OR  Informed Consent: I have reviewed the patients History and Physical, chart, labs and discussed the procedure including the risks, benefits and alternatives for the proposed anesthesia with the patient or authorized representative who has indicated his/her understanding and acceptance.     Dental advisory given  Plan Discussed with: CRNA and Anesthesiologist  Anesthesia Plan Comments:         Anesthesia Quick Evaluation

## 2021-05-27 NOTE — Progress Notes (Signed)
Mobility Specialist Progress Note    05/27/21 1014  Mobility  Activity Refused mobility   Pt just stated he couldn't do it right now. Will f/u as schedule permits.   [facility] Mobility Specialist  M.S. 5N: [phone]

## 2021-05-27 NOTE — Consult Note (Signed)
ORTHOPAEDIC CONSULTATION  REQUESTING PHYSICIAN: Bonnielee Haff, MD  Chief Complaint: Osteomyelitis and abscess left heel.  HPI: [name] is a 51 y.o. male who presents with osteomyelitis and abscess left heel.  Patient has undergone prolonged limb salvage intervention.  Despite all efforts to control the infection in the left foot patient has persistent deep osteomyelitis of the calcaneus.  Past Medical History:  Diagnosis Date   Diabetes mellitus without complication ([place])    Hypertension    Past Surgical History:  Procedure Laterality Date   AMPUTATION TOE Left 10/21/2020   Procedure: LEFT SECOND TOE AMPUTATION;  Surgeon: Evelina Bucy, DPM;  Location: [facility];  Service: Podiatry;  Laterality: Left;   mastoid surgery     Social History   Socioeconomic History   Marital status: Married    Spouse name: Not on file   Number of children: Not on file   Years of education: Not on file   Highest education level: Not on file  Occupational History   Not on file  Tobacco Use   Smoking status: Former   Smokeless tobacco: Never  Substance and Sexual Activity   Alcohol use: No   Drug use: No   Sexual activity: Not on file  Other Topics Concern   Not on file  Social History Narrative   Not on file   Social Determinants of Health   Financial Resource Strain: Not on file  Food Insecurity: Not on file  Transportation Needs: Not on file  Physical Activity: Not on file  Stress: Not on file  Social Connections: Not on file   Family History  Problem Relation Age of Onset   Diabetes Mellitus II Sister    - negative except otherwise stated in the family history section Allergies  Allergen Reactions   Piperacillin Hives   Tazobactam Hives   Prior to Admission medications   Medication Sig Start Date End Date Taking? Authorizing Provider  calcium carbonate (OSCAL) 1500 (600 Ca) MG TABS tablet Take 600 mg of elemental calcium by mouth daily with breakfast.   Yes  [provider]  diclofenac Sodium (VOLTAREN) 1 % GEL Apply 2 g topically daily as needed (pain).   Yes [provider]  ferrous sulfate 324 MG TBEC 324 mg daily with breakfast. 05/16/20  Yes [provider]  HUMALOG KWIKPEN 100 UNIT/ML KwikPen 0-15 Units 3 (three) times daily. Sliding scale; patient not sure about scale. 07/11/20  Yes [provider]  insulin detemir (LEVEMIR) 100 UNIT/ML FlexPen Inject 28 Units into the skin at bedtime. 05/09/19  Yes [provider]  Lactobacillus (PROBIOTIC ACIDOPHILUS PO) Take 1 capsule by mouth daily.   Yes [provider]  losartan (COZAAR) 25 MG tablet Take 25 mg by mouth daily. 06/17/20  Yes [provider]  meloxicam (MOBIC) 7.5 MG tablet Take 7.5 mg by mouth daily as needed for pain.   Yes [provider]  methocarbamol (ROBAXIN) 500 MG tablet Take 500 mg by mouth 3 (three) times daily.   Yes [provider]  Potassium 99 MG TABS Take 99 mg by mouth daily.   Yes [provider]  sildenafil (REVATIO) 20 MG tablet Take 20 mg by mouth daily as needed (erectile dysfunction). 10/09/20  Yes [provider]  vitamin B-12 (CYANOCOBALAMIN) 1000 MCG tablet Take 1,000 mcg by mouth daily.   Yes [provider]  doxycycline (VIBRA-TABS) 100 MG tablet Take 1 tablet (100 mg total) by mouth 2 (two) times daily. Patient  not taking: Reported on 05/26/2021 04/03/21   Evelina Bucy, DPM  Insulin Glargine (BASAGLAR KWIKPEN) 100 UNIT/ML INJECT 32 UNITS UNDER THE SKIN NIGHTLY (START ONLY AFTER RUNNING OUT OF LEVEMIR) Patient not taking: Reported on 05/26/2021 07/11/20   [provider]  silver sulfADIAZINE (SILVADENE) 1 % cream Apply pea-sized amount to wound daily. Patient not taking: Reported on 05/26/2021 02/11/21   Evelina Bucy, DPM   MR FOOT LEFT WO CONTRAST  Result Date: 05/27/2021 CLINICAL DATA:  Evaluate for osteomyelitis of the foot. History of diabetes.  Transfer from [facility]. Left calcaneal abscess with approximally 300 mL of pus drainage. Fever of 103. Gram-positive cocci staph aureus detected by PCR. Status post surgery on left heel 05/2019. EXAM: MRI OF THE LEFT FOOT WITHOUT CONTRAST TECHNIQUE: Multiplanar, multisequence MR imaging of the left hindfoot was performed. No intravenous contrast was administered. COMPARISON:  left foot radiographs 05/26/2021 11/01/2020; MRI left forefoot 08/28/2020 MRI left hindfoot 05/29/2020 FINDINGS: Bones/Joint/Cartilage Postsurgical changes are again seen on prior posteroinferior calcaneus resection. There is increased erosion compared to 05/29/2020 within the posteroinferior aspect of the calcaneus. There is moderate diffuse marrow edema throughout the calcaneus greatest within the posterior plantar aspect radius findings are suspicious for active osteomyelitis. Moderate dorsal talonavicular degenerative osteophytosis. Severe navicular-cuneiform diffuse cartilage thinning and joint space narrowing. New moderate subchondral marrow edema within the proximal aspect of the lateral cuneiform. There are again small tibiotalar and posterior subtalar joint effusions. This again raises suspicion for septic arthritis. Ligaments The medial and lateral ankle ligaments are intact. The Lisfranc ligament complex is intact. Muscles and Tendons There is moderate edema within the plantar foot musculature nonspecific myositis. There appears to be new mild connection of the distal Achilles tendon in the region of the prior full-thickness tear or surgical release. This may be secondary to scarring. Soft tissues There is diffuse edema and moderate soft tissue swelling throughout the posterior plantar hindfoot soft tissues consistent with the prior ulceration and postsurgical changes. There is scattered low signal with blooming artifact within the posterior plantar medial soft tissues. Some of the blooming artifact is compatible with  metallic postsurgical change however this raises suspicion for air from a gas producing organism, especially as no definite metallic density is seen in this region on recent 05/26/2021 radiographs. There is decreased T1 and decreased T2 soft tissue signal in a somewhat bandlike configuration in the region of the prior complete soft tissue ulceration previously completely contact of the posteroinferior calcaneus on 05/29/2020 prior MRI. This is compatible with scarring in the region of the prior sinus tract/surgical wound dehiscence, however the edema and air suggests infectious cellulitis that contacts the posteroinferior plantar aspect of the remaining calcaneus. There are also small areas of fluid signal seen plantar to the remaining hindfoot flexor digitorum brevis musculature and deep to the abductor hallucis musculature. Otherwise no large abscess is visualized. IMPRESSION:: IMPRESSION: 1. Status post partial resection of the posteroinferior aspect of the calcaneus for osteomyelitis. Increased erosion of the posteroinferior aspect of the calcaneus compared to 05/29/2020 prior MRI. There is also extensive marrow edema throughout the calcaneus. Findings are concerning for active/acute osteomyelitis. 2. Some scarring in the prior of the ulceration or surgical wound dehiscence at the posterior plantar aspect of the left heel. Surrounding likely infectious cellulitis with edema swelling and artifact suspicious for a gas producing organism. 3. Small tibiotalar and posterior subtalar joint effusions again concerning for septic arthritis. 4. Interval scarring of the prior full-thickness distal Achilles tendon tear  with mild new tendon continuity. 5. Diffuse edema is again seen throughout the intrinsic foot musculature compatible with nonspecific myositis. Electronically Signed   By: Yvonne Kendall M.D.   On: 05/27/2021 08:33   VAS [place] ABI WITH/WO TBI  Result Date: 05/27/2021  LOWER EXTREMITY DOPPLER STUDY Patient  Name:  NOX DIGENOVA  Date of Exam:   05/27/2021 Medical Rec #: [id number]     Accession #:    [id number] Date of Birth: [birth year]     Patient Gender: M Patient Age:   25 years Exam Location:  [facility] Procedure:      VAS [place] ABI WITH/WO TBI Referring Phys: Jule Ser --------------------------------------------------------------------------------  Indications: Ulceration. High Risk Factors: Hypertension, Diabetes, past history of smoking.  Comparison Study: Patient states previous exam performed at outside facility                   [facility] unable to see results. Performing Technologist: Rogelia Rohrer RVT, RDMS  Examination Guidelines: A complete evaluation includes at minimum, Doppler waveform signals and systolic blood pressure reading at the level of bilateral brachial, anterior tibial, and posterior tibial arteries, when vessel segments are accessible. Bilateral testing is considered an integral part of a complete examination. Photoelectric Plethysmograph (PPG) waveforms and toe systolic pressure readings are included as required and additional duplex testing as needed. Limited examinations for reoccurring indications may be performed as noted.  ABI Findings: +---------+------------------+-----+---------+--------+  Right     Rt Pressure (mmHg) Index Waveform  Comment   +---------+------------------+-----+---------+--------+  Brachial                           triphasic         +---------+------------------+-----+---------+--------+  PTA       160                1.13  triphasic           +---------+------------------+-----+---------+--------+  DP        143                1.01  biphasic            +---------+------------------+-----+---------+--------+  Great Toe 124                0.88  Normal              +---------+------------------+-----+---------+--------+ +---------+------------------+-----+---------+-------+  Left      Lt Pressure (mmHg) Index Waveform  Comment   +---------+------------------+-----+---------+-------+  Brachial  141                      triphasic          +---------+------------------+-----+---------+-------+  PTA       147                1.04  triphasic          +---------+------------------+-----+---------+-------+  DP        154                1.09  triphasic          +---------+------------------+-----+---------+-------+  Great Toe 130                0.92  Normal             +---------+------------------+-----+---------+-------+ +-------+-----------+-----------+------------+------------+  ABI/TBI Today's ABI Today's TBI Previous ABI Previous TBI  +-------+-----------+-----------+------------+------------+  Right   1.13  0.88                                   +-------+-----------+-----------+------------+------------+  Left    1.09        0.92                                   +-------+-----------+-----------+------------+------------+    PT did not want Pressure taken on RUE due to recent injury.    Preliminary    ECHOCARDIOGRAM COMPLETE  Result Date: 05/27/2021    ECHOCARDIOGRAM REPORT   Patient Name:   RON HAAB Date of Exam: 05/27/2021 Medical Rec #:  [id number]    Height:       75.0 in Accession #:    [id number]   Weight:       167.5 lb Date of Birth:  [birth year]    BSA:          2.035 m Patient Age:    42 years     BP:           142/78 mmHg Patient Gender: M            HR:           79 bpm. Exam Location:  Inpatient Procedure: 2D Echo, Cardiac Doppler and Color Doppler Indications:    Bacteremia R78.81  History:        Patient has no prior history of Echocardiogram examinations.                 Risk Factors:Diabetes.  Sonographer:    Bernadene Person RDCS Referring Phys: [id number] Young Harris  1. Left ventricular ejection fraction, by estimation, is 55 to 60%. The left ventricle has normal function. The left ventricle has no regional wall motion abnormalities. Left ventricular diastolic function could not be evaluated.  2.  Right ventricular systolic function is normal. The right ventricular size is normal.  3. The mitral valve is normal in structure. No evidence of mitral valve regurgitation. No evidence of mitral stenosis.  4. The aortic valve is normal in structure. Aortic valve regurgitation is not visualized. No aortic stenosis is present.  5. Evidence of atrial level shunting detected by color flow Doppler. FINDINGS  Left Ventricle: Left ventricular ejection fraction, by estimation, is 55 to 60%. The left ventricle has normal function. The left ventricle has no regional wall motion abnormalities. The left ventricular internal cavity size was normal in size. There is  no left ventricular hypertrophy. Left ventricular diastolic function could not be evaluated. Right Ventricle: The right ventricular size is normal. Right vetricular wall thickness was not well visualized. Right ventricular systolic function is normal. Left Atrium: Left atrial size was normal in size. Right Atrium: Right atrial size was normal in size. Pericardium: There is no evidence of pericardial effusion. Mitral Valve: The mitral valve is normal in structure. No evidence of mitral valve regurgitation. No evidence of mitral valve stenosis. There is no evidence of mitral valve vegetation. Tricuspid Valve: The tricuspid valve is normal in structure. Tricuspid valve regurgitation is trivial. Aortic Valve: The aortic valve is normal in structure. Aortic valve regurgitation is not visualized. No aortic stenosis is present. Pulmonic Valve: The pulmonic valve was normal in structure. Pulmonic valve regurgitation is not visualized. Aorta: The aortic root and ascending aorta are structurally normal, with no evidence of dilitation.  IAS/Shunts: Evidence of atrial level shunting detected by color flow Doppler.  LEFT VENTRICLE PLAX 2D LVIDd:         5.00 cm LVIDs:         3.70 cm LV PW:         1.00 cm LV IVS:        0.90 cm LVOT diam:     2.10 cm LV SV:         57 LV SV  Index:   28 LVOT Area:     3.46 cm  LV Volumes (MOD) LV vol d, MOD A2C: 130.0 ml LV vol d, MOD A4C: 97.7 ml LV vol s, MOD A2C: 57.8 ml LV vol s, MOD A4C: 44.7 ml LV SV MOD A2C:     72.2 ml LV SV MOD A4C:     97.7 ml LV SV MOD BP:      66.3 ml RIGHT VENTRICLE RV S prime:     11.00 cm/s TAPSE (M-mode): 2.0 cm LEFT ATRIUM             Index        RIGHT ATRIUM           Index LA diam:        3.10 cm 1.52 cm/m   RA Area:     14.20 cm LA Vol (A2C):   32.1 ml 15.77 ml/m  RA Volume:   36.60 ml  17.98 ml/m LA Vol (A4C):   33.2 ml 16.31 ml/m LA Biplane Vol: 33.3 ml 16.36 ml/m  AORTIC VALVE LVOT Vmax:   90.80 cm/s LVOT Vmean:  56.100 cm/s LVOT VTI:    0.166 m  AORTA Ao Root diam: 3.00 cm MITRAL VALVE MV Area (PHT): 4.17 cm    SHUNTS MV Decel Time: 182 msec    Systemic VTI:  0.17 m MV E velocity: 67.70 cm/s  Systemic Diam: 2.10 cm MV A velocity: 61.30 cm/s MV E/A ratio:  1.10 Mertie Moores MD Electronically signed by Mertie Moores MD Signature Date/Time: 05/27/2021/3:14:59 PM    Final    - pertinent xrays, CT, MRI studies were reviewed and independently interpreted  Positive ROS: All other systems have been reviewed and were otherwise negative with the exception of those mentioned in the HPI and as above.  Physical Exam: General: Alert, no acute distress Psychiatric: Patient is competent for consent with normal mood and affect Lymphatic: No axillary or cervical lymphadenopathy Cardiovascular: No pedal edema Respiratory: No cyanosis, no use of accessory musculature GI: No organomegaly, abdomen is soft and non-tender    Images:  @ENCIMAGES @  Labs:  Lab Results  Component Value Date   HGBA1C 9.0 (H) 05/26/2021   HGBA1C 9.2 (A) 06/21/2019   HGBA1C 10.3 (H) 11/06/2013   ESRSEDRATE 3 11/05/2013   REPTSTATUS PENDING 05/26/2021   CULT  05/26/2021    NO GROWTH < 24 HOURS Performed at [facility], [place] [address], [place], [place] [address]     Lab Results  Component Value Date   ALBUMIN  2.4 (L) 05/26/2021   ALBUMIN 4.5 11/05/2013   ALBUMIN 3.5 11/15/2012     CBC EXTENDED Latest Ref Rng & Units 05/27/2021 10/21/2020 10/20/2020  WBC 4.0 - 10.5 K/uL 10.7(H) 5.0 4.8  RBC 4.22 - 5.81 MIL/uL 3.89(L) 4.05(L) 4.18(L)  HGB 13.0 - 17.0 g/dL 11.5(L) 12.3(L) 12.8(L)  HCT 39.0 - 52.0 % 34.4(L) 37.5(L) 38.0(L)  PLT 150 - 400 K/uL 400 228 244  NEUTROABS 1.7 - 7.7 K/uL - -  2.7  LYMPHSABS 0.7 - 4.0 K/uL - - 1.4    Neurologic: Patient does not have protective sensation bilateral lower extremities.   MUSCULOSKELETAL:   Skin: Examination patient has cellulitis and purulent drainage from the left heel.  Patient has a strong dorsalis pedis pulse.  Review of the MRI scan shows osteomyelitis involving the entire calcaneus.  Patient's white blood cell count is elevated at 10.7.  Hemoglobin stable at 11.5.  Patient has severe protein caloric malnutrition with an albumin of 2.4.  Hemoglobin A1c 9.0.  Assessment: Assessment: Abscess ulceration and osteomyelitis of the left calcaneus.  Plan: Discussed with the patient recommendation to proceed with a transtibial amputation.  Postoperative course was discussed risk and benefits were discussed.  Patient states he understands wished to proceed at this time.  Anticipate patient could be discharged to inpatient rehab.  Plan for surgery tomorrow morning at 9 AM.  I also discussed with the patient that I will be out of town starting Wednesday.  Thank you for the consult and the opportunity to see Mr. Seaborn Carroll, [place] [phone] 6:18 PM

## 2021-05-27 NOTE — Progress Notes (Signed)
Called in to patient's room because he felt woozy and like he was "drifting off". Went over all medications given with nothing standing out to have caused that feeling.  CBG checked again and was at 279 (gave 3 units at 0812 to cover 0800 blood sugar).  Bp checked and was 122/78. Patient is concerned and asking if it could be his sinuses.  Has asked for Tylenol. Will continue to monitor. [name] C

## 2021-05-27 NOTE — Progress Notes (Addendum)
[facility] PROGRESS NOTE   [name] [id number] DOB: [birth year] DOA: 05/26/2021  1 DOS: the patient was seen and examined on 05/27/2021  PCP: Martinique, Sarah T, MD  Brief History and Hospital Course:  51 y.o. male with medical history significant of TDM, HTN, who presented to [facility] as a transfer from [facility]. He was evaluated in ED on 05/26/21. Sugar was 580 on arrival, but no DKA. Received IVF. Also had fever to 103. Abscess on left calcaneal area was incised and drained with approximately 300cc of pus drainage. Culture and gram stain obtained. Blood culture showed gram positive cocci-staph aureus detected by pcr. He states he had surgery on his left heel in 2021. He states after surgery he had no issues. He states about 1-2 days prior to admission he pushed on his left heel and it felt "mushy." And was mildly red. He knew something wasn't right so he went to ED. was transferred to [facility] to be evaluated by podiatry and possibly by orthopedics.  Consultants: Podiatry.  Infectious disease.  Procedures: None yet    Subjective: Pain is well controlled in the left foot.  He does complain of pain in this right shoulder.  Denies any chest pain shortness of breath nausea or vomiting.    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  Patient currently on vancomycin cefepime and metronidazole. He underwent MRI of his foot which showed raised concern for acute osteomyelitis of the calcaneum.  Findings suggestive of cellulitis also noted.  Small tibiotalar and posterior subtalar joint effusions noted raising concern for septic arthritis.  Diffuse edema noted throughout the intrinsic foot musculature compatible with nonspecific myositis. Podiatry was  consulted.  Wait for their input.  This may need orthopedic involvement but we will first wait for podiatry input. Pain is reasonably well controlled.  No clear evidence of sepsis currently.  Discussed with Dr. Jacqualyn Posey with podiatry and he reviewed the MRI and recommended that orthopedics be involved.  Discussed with Dr. Sharol Given who will see the patient later today and plan to operate tomorrow.  Bacteremia Per records from [place] had blood culture with  +staph aureus in setting of left foot abscess and possible acute on chronic osteo. On antibiotics as discussed above including vancomycin cefepime and metronidazole.  Infectious diseases been consulted. Blood cultures to be repeated. May need echocardiogram but will defer to infectious disease.  Insulin-requiring or dependent type II diabetes mellitus ([place]) HbA1c is pending.  CBGs were very poorly controlled at arrival.  He is currently on Levemir as well as SSI which is being continued.  May need dose adjustments.    Pernicious anemia- (present on admission) Continue B12 supplements.  Hemoglobin is stable.  Mild bleeding is noted from his left foot wound.  Right shoulder pain Apparently twisted his arm a week or so ago with now limited range of motion of the right shoulder with pain.  Discussed with patient.  Will need for his acute issues to subside before any evaluation for the shoulder can be performed.  He did undergo plain films apparently in [place] which were unremarkable.  Pain control for now.  PT evaluation.  Further work-up in the outpatient setting.    Essential hypertension- (present on admission) Stable.  Continue losartan.  DVT Prophylaxis: Chemoprophylaxis on hold in case surgery is needed Code Status: Full code Family Communication: Discussed with patient Disposition Plan: To be determined  Status is: Inpatient Remains inpatient appropriate because: Left foot osteomyelitis with cellulitis requiring IV  antibiotics     Medications: Scheduled:  diclofenac Sodium  4 g Topical QID   ferrous sulfate  324 mg Oral Q breakfast   insulin aspart  0-20 Units Subcutaneous TID WC   insulin aspart  0-5 Units Subcutaneous QHS   insulin detemir  14 Units Subcutaneous QHS   losartan  25 mg Oral Daily   melatonin  5 mg Oral QHS   sodium chloride flush  3 mL Intravenous Q12H   vitamin B-12  1,000 mcg Oral Daily   Continuous:  sodium chloride     ceFEPime (MAXIPIME) IV 2 g (05/27/21 0653)   metronidazole 500 mg (05/27/21 0816)   [id number] chloride, acetaminophen **OR** acetaminophen, ondansetron **OR** ondansetron (ZOFRAN) IV, oxyCODONE, sodium chloride flush  Antibiotics: Anti-infectives (From admission, onward)    Start     Dose/Rate Route Frequency Ordered Stop   05/27/21 0900  vancomycin (VANCOREADY) IVPB 1500 mg/300 mL  Status:  Discontinued        1,500 mg 150 mL/hr over 120 Minutes Intravenous Every 12 hours 05/26/21 2001 05/27/21 0857   05/26/21 2200  ceFEPIme (MAXIPIME) 2 g in sodium chloride 0.9 % 100 mL IVPB        2 g 200 mL/hr over 30 Minutes Intravenous Every 8 hours 05/26/21 1850     05/26/21 2000  metroNIDAZOLE (FLAGYL) IVPB 500 mg        500 mg 100 mL/hr over 60 Minutes Intravenous Every 12 hours 05/26/21 1820     05/26/21 1945  vancomycin (VANCOREADY) IVPB 1750 mg/350 mL        1,750 mg 175 mL/hr over 120 Minutes Intravenous  Once 05/26/21 1850 05/27/21 0109       Objective:  Vital Signs  Vitals:   05/27/21 0245 05/27/21 0553 05/27/21 0756 05/27/21 0912  BP:  127/81 (!) 142/78 122/78  Pulse:  74 81   Resp:  18 18   Temp: 98.4 F (36.9 C) 97.9 F (36.6 C) 97.8 F (36.6 C)   TempSrc: Oral Oral Oral   SpO2:  98% 98% 99%  Weight:      Height:        Intake/Output Summary (Last 24 hours) at 05/27/2021 1240 Last data filed at 05/27/2021 1100 Gross per 24 hour  Intake 1185 ml  Output 1200 ml  Net -15 ml   Filed Weights   05/26/21 1722  Weight: 76 kg     General appearance: Awake alert.  In no distress Resp: Clear to auscultation bilaterally.  Normal effort Cardio: S1-S2 is normal regular.  No S3-S4.  No rubs murmurs or bruit GI: Abdomen is soft.  Nontender nondistended.  Bowel sounds are present normal.  No masses organomegaly Extremities: Left foot covered in dressing.  Good pedal pulses noted.  Limited range of motion of the right shoulder.  No swelling noted over the shoulder joint. Neurologic: Alert and oriented x3.  No focal neurological deficits.    Lab Results:  Data Reviewed: I have personally reviewed labs and imaging study reports  CBC: Recent Labs  Lab 05/27/21 0232  WBC 10.7*  HGB 11.5*  HCT 34.4*  MCV 88.4  PLT [id number]    Basic Metabolic Panel: Recent Labs  Lab 05/26/21 1849 05/27/21 0232  NA 130* 133*  K 4.3 4.2  CL 93* 96*  CO2 22 22  GLUCOSE 394* 324*  BUN 13 14  CREATININE 0.85 0.89  CALCIUM 8.5* 8.5*    GFR: Estimated Creatinine Clearance: 104.4 mL/min (by C-G formula based on SCr of 0.89 mg/dL).  Liver Function Tests: Recent Labs  Lab 05/26/21 1849  AST 20  ALT 17  ALKPHOS 110  BILITOT 0.6  PROT 6.9  ALBUMIN 2.4*     Coagulation Profile: Recent Labs  Lab 05/26/21 1849  INR 1.2     HbA1C: Recent Labs    05/26/21 1846  HGBA1C 9.0*    CBG: Recent Labs  Lab 05/27/21 0244 05/27/21 0555 05/27/21 0753 05/27/21 0908 05/27/21 1124  GLUCAP 305* 241* 241* 279* 265*    Radiology Studies: MR FOOT LEFT WO CONTRAST  Result Date: 05/27/2021 CLINICAL DATA:  Evaluate for osteomyelitis of the foot. History of diabetes. Transfer from [facility]. Left calcaneal abscess with approximally 300 mL of pus drainage. Fever of 103. Gram-positive cocci staph aureus detected by PCR. Status post surgery on left heel 05/2019. EXAM: MRI OF THE LEFT FOOT WITHOUT CONTRAST TECHNIQUE: Multiplanar, multisequence MR imaging of the left hindfoot was performed. No intravenous contrast was  administered. COMPARISON:  left foot radiographs 05/26/2021 11/01/2020; MRI left forefoot 08/28/2020 MRI left hindfoot 05/29/2020 FINDINGS: Bones/Joint/Cartilage Postsurgical changes are again seen on prior posteroinferior calcaneus resection. There is increased erosion compared to 05/29/2020 within the posteroinferior aspect of the calcaneus. There is moderate diffuse marrow edema throughout the calcaneus greatest within the posterior plantar aspect radius findings are suspicious for active osteomyelitis. Moderate dorsal talonavicular degenerative osteophytosis. Severe navicular-cuneiform diffuse cartilage thinning and joint space narrowing. New moderate subchondral marrow edema within the proximal aspect of the lateral cuneiform. There are again small tibiotalar and posterior subtalar joint effusions. This again raises suspicion for septic arthritis. Ligaments The medial and lateral ankle ligaments are intact. The Lisfranc ligament complex is intact. Muscles and Tendons There is moderate edema within the plantar foot musculature nonspecific myositis. There appears to be new mild connection of the distal Achilles tendon in the region of the prior full-thickness tear or surgical release. This may be secondary to scarring. Soft tissues There is diffuse edema and moderate soft tissue swelling throughout the posterior plantar hindfoot soft tissues consistent with the prior ulceration and postsurgical changes. There is scattered low signal with blooming artifact within the posterior plantar medial soft tissues. Some of the blooming artifact is compatible with metallic postsurgical change however this raises suspicion for air from a gas producing organism, especially as no definite metallic density is seen in this region on recent 05/26/2021 radiographs. There is decreased T1 and decreased T2 soft tissue signal in a somewhat bandlike configuration in the region of the prior complete soft tissue ulceration previously  completely contact of the posteroinferior calcaneus on 05/29/2020 prior MRI. This is compatible with scarring in the region of the prior sinus tract/surgical wound dehiscence, however the edema and air suggests infectious cellulitis that contacts the posteroinferior plantar aspect of the remaining calcaneus. There are also small areas of fluid signal seen plantar to the remaining hindfoot flexor digitorum brevis musculature and deep to the abductor hallucis musculature. Otherwise no large abscess is visualized. IMPRESSION:: IMPRESSION: 1. Status post partial resection of the posteroinferior aspect of the calcaneus for osteomyelitis. Increased erosion of the posteroinferior aspect of the calcaneus compared to 05/29/2020 prior MRI. There is also extensive marrow edema throughout the calcaneus. Findings are concerning for active/acute osteomyelitis. 2.  Some scarring in the prior of the ulceration or surgical wound dehiscence at the posterior plantar aspect of the left heel. Surrounding likely infectious cellulitis with edema swelling and artifact suspicious for a gas producing organism. 3. Small tibiotalar and posterior subtalar joint effusions again concerning for septic arthritis. 4. Interval scarring of the prior full-thickness distal Achilles tendon tear with mild new tendon continuity. 5. Diffuse edema is again seen throughout the intrinsic foot musculature compatible with nonspecific myositis. Electronically Signed   By: Yvonne Kendall M.D.   On: 05/27/2021 08:33       LOS: 1 day   [facility] Pager on [email or web address]  05/27/2021, 12:40 PM

## 2021-05-27 NOTE — H&P (View-Only) (Signed)
° ° °ORTHOPAEDIC CONSULTATION ° °REQUESTING PHYSICIAN: Krishnan, Gokul, MD ° °Chief Complaint: Osteomyelitis and abscess left heel. ° °HPI: °[name] is a 50 y.o. male who presents with osteomyelitis and abscess left heel.  Patient has undergone prolonged limb salvage intervention.  Despite all efforts to control the infection in the left foot patient has persistent deep osteomyelitis of the calcaneus.  °Past Medical History:  °Diagnosis Date  ° Diabetes mellitus without complication (HCC)   ° Hypertension   ° °Past Surgical History:  °Procedure Laterality Date  ° AMPUTATION TOE Left 10/21/2020  ° Procedure: LEFT SECOND TOE AMPUTATION;  Surgeon: Price, Michael J, DPM;  Location: [facility];  Service: Podiatry;  Laterality: Left;  ° mastoid surgery    ° °Social History  ° °Socioeconomic History  ° Marital status: Married  °  Spouse name: Not on file  ° Number of children: Not on file  ° Years of education: Not on file  ° Highest education level: Not on file  °Occupational History  ° Not on file  °Tobacco Use  ° Smoking status: Former  ° Smokeless tobacco: Never  °Substance and Sexual Activity  ° Alcohol use: No  ° Drug use: No  ° Sexual activity: Not on file  °Other Topics Concern  ° Not on file  °Social History Narrative  ° Not on file  ° °Social Determinants of Health  ° °Financial Resource Strain: Not on file  °Food Insecurity: Not on file  °Transportation Needs: Not on file  °Physical Activity: Not on file  °Stress: Not on file  °Social Connections: Not on file  ° °Family History  °Problem Relation Age of Onset  ° Diabetes Mellitus II Sister   ° °- negative except otherwise stated in the family history section °Allergies  °Allergen Reactions  ° Piperacillin Hives  ° Tazobactam Hives  ° °Prior to Admission medications   °Medication Sig Start Date End Date Taking? Authorizing Provider  °calcium carbonate (OSCAL) 1500 (600 Ca) MG TABS tablet Take 600 mg of elemental calcium by mouth daily with breakfast.   Yes  [provider]  °diclofenac Sodium (VOLTAREN) 1 % GEL Apply 2 g topically daily as needed (pain).   Yes [provider]  °ferrous sulfate 324 MG TBEC 324 mg daily with breakfast. 05/16/20  Yes [provider]  °HUMALOG KWIKPEN 100 UNIT/ML KwikPen 0-15 Units 3 (three) times daily. Sliding scale; patient not sure about scale. 07/11/20  Yes [provider]  °insulin detemir (LEVEMIR) 100 UNIT/ML FlexPen Inject 28 Units into the skin at bedtime. 05/09/19  Yes [provider]  °Lactobacillus (PROBIOTIC ACIDOPHILUS PO) Take 1 capsule by mouth daily.   Yes [provider]  °losartan (COZAAR) 25 MG tablet Take 25 mg by mouth daily. 06/17/20  Yes [provider]  °meloxicam (MOBIC) 7.5 MG tablet Take 7.5 mg by mouth daily as needed for pain.   Yes [provider]  °methocarbamol (ROBAXIN) 500 MG tablet Take 500 mg by mouth 3 (three) times daily.   Yes [provider]  °Potassium 99 MG TABS Take 99 mg by mouth daily.   Yes [provider]  °sildenafil (REVATIO) 20 MG tablet Take 20 mg by mouth daily as needed (erectile dysfunction). 10/09/20  Yes [provider]  °vitamin B-12 (CYANOCOBALAMIN) 1000 MCG tablet Take 1,000 mcg by mouth daily.   Yes [provider]  °doxycycline (VIBRA-TABS) 100 MG tablet Take 1 tablet (100 mg total) by mouth 2 (two) times daily. °Patient   not taking: Reported on 05/26/2021 04/03/21   Evelina Bucy, DPM  Insulin Glargine (BASAGLAR KWIKPEN) 100 UNIT/ML INJECT 32 UNITS UNDER THE SKIN NIGHTLY (START ONLY AFTER RUNNING OUT OF LEVEMIR) Patient not taking: Reported on 05/26/2021 07/11/20   [provider]  silver sulfADIAZINE (SILVADENE) 1 % cream Apply pea-sized amount to wound daily. Patient not taking: Reported on 05/26/2021 02/11/21   Evelina Bucy, DPM   MR FOOT LEFT WO CONTRAST  Result Date: 05/27/2021 CLINICAL DATA:  Evaluate for osteomyelitis of the foot. History of diabetes.  Transfer from [facility]. Left calcaneal abscess with approximally 300 mL of pus drainage. Fever of 103. Gram-positive cocci staph aureus detected by PCR. Status post surgery on left heel 05/2019. EXAM: MRI OF THE LEFT FOOT WITHOUT CONTRAST TECHNIQUE: Multiplanar, multisequence MR imaging of the left hindfoot was performed. No intravenous contrast was administered. COMPARISON:  left foot radiographs 05/26/2021 11/01/2020; MRI left forefoot 08/28/2020 MRI left hindfoot 05/29/2020 FINDINGS: Bones/Joint/Cartilage Postsurgical changes are again seen on prior posteroinferior calcaneus resection. There is increased erosion compared to 05/29/2020 within the posteroinferior aspect of the calcaneus. There is moderate diffuse marrow edema throughout the calcaneus greatest within the posterior plantar aspect radius findings are suspicious for active osteomyelitis. Moderate dorsal talonavicular degenerative osteophytosis. Severe navicular-cuneiform diffuse cartilage thinning and joint space narrowing. New moderate subchondral marrow edema within the proximal aspect of the lateral cuneiform. There are again small tibiotalar and posterior subtalar joint effusions. This again raises suspicion for septic arthritis. Ligaments The medial and lateral ankle ligaments are intact. The Lisfranc ligament complex is intact. Muscles and Tendons There is moderate edema within the plantar foot musculature nonspecific myositis. There appears to be new mild connection of the distal Achilles tendon in the region of the prior full-thickness tear or surgical release. This may be secondary to scarring. Soft tissues There is diffuse edema and moderate soft tissue swelling throughout the posterior plantar hindfoot soft tissues consistent with the prior ulceration and postsurgical changes. There is scattered low signal with blooming artifact within the posterior plantar medial soft tissues. Some of the blooming artifact is compatible with  metallic postsurgical change however this raises suspicion for air from a gas producing organism, especially as no definite metallic density is seen in this region on recent 05/26/2021 radiographs. There is decreased T1 and decreased T2 soft tissue signal in a somewhat bandlike configuration in the region of the prior complete soft tissue ulceration previously completely contact of the posteroinferior calcaneus on 05/29/2020 prior MRI. This is compatible with scarring in the region of the prior sinus tract/surgical wound dehiscence, however the edema and air suggests infectious cellulitis that contacts the posteroinferior plantar aspect of the remaining calcaneus. There are also small areas of fluid signal seen plantar to the remaining hindfoot flexor digitorum brevis musculature and deep to the abductor hallucis musculature. Otherwise no large abscess is visualized. IMPRESSION:: IMPRESSION: 1. Status post partial resection of the posteroinferior aspect of the calcaneus for osteomyelitis. Increased erosion of the posteroinferior aspect of the calcaneus compared to 05/29/2020 prior MRI. There is also extensive marrow edema throughout the calcaneus. Findings are concerning for active/acute osteomyelitis. 2. Some scarring in the prior of the ulceration or surgical wound dehiscence at the posterior plantar aspect of the left heel. Surrounding likely infectious cellulitis with edema swelling and artifact suspicious for a gas producing organism. 3. Small tibiotalar and posterior subtalar joint effusions again concerning for septic arthritis. 4. Interval scarring of the prior full-thickness distal Achilles tendon tear  with mild new tendon continuity. 5. Diffuse edema is again seen throughout the intrinsic foot musculature compatible with nonspecific myositis. Electronically Signed   By: Yvonne Kendall M.D.   On: 05/27/2021 08:33   VAS [place] ABI WITH/WO TBI  Result Date: 05/27/2021  LOWER EXTREMITY DOPPLER STUDY Patient  Name:  [name]  Date of Exam:   05/27/2021 Medical Rec #: [id number]     Accession #:    [id number] Date of Birth: 06/02/1970     Patient Gender: M Patient Age:   76 years Exam Location:  [facility] Procedure:      VAS [place] ABI WITH/WO TBI Referring Phys: Jule Ser --------------------------------------------------------------------------------  Indications: Ulceration. High Risk Factors: Hypertension, Diabetes, past history of smoking.  Comparison Study: Patient states previous exam performed at outside facility                   [facility].) unable to see results. Performing Technologist: Rogelia Rohrer RVT, RDMS  Examination Guidelines: A complete evaluation includes at minimum, Doppler waveform signals and systolic blood pressure reading at the level of bilateral brachial, anterior tibial, and posterior tibial arteries, when vessel segments are accessible. Bilateral testing is considered an integral part of a complete examination. Photoelectric Plethysmograph (PPG) waveforms and toe systolic pressure readings are included as required and additional duplex testing as needed. Limited examinations for reoccurring indications may be performed as noted.  ABI Findings: +---------+------------------+-----+---------+--------+  Right     Rt Pressure (mmHg) Index Waveform  Comment   +---------+------------------+-----+---------+--------+  Brachial                           triphasic         +---------+------------------+-----+---------+--------+  PTA       160                1.13  triphasic           +---------+------------------+-----+---------+--------+  DP        143                1.01  biphasic            +---------+------------------+-----+---------+--------+  Great Toe 124                0.88  Normal              +---------+------------------+-----+---------+--------+ +---------+------------------+-----+---------+-------+  Left      Lt Pressure (mmHg) Index Waveform  Comment   +---------+------------------+-----+---------+-------+  Brachial  141                      triphasic          +---------+------------------+-----+---------+-------+  PTA       147                1.04  triphasic          +---------+------------------+-----+---------+-------+  DP        154                1.09  triphasic          +---------+------------------+-----+---------+-------+  Great Toe 130                0.92  Normal             +---------+------------------+-----+---------+-------+ +-------+-----------+-----------+------------+------------+  ABI/TBI Today's ABI Today's TBI Previous ABI Previous TBI  +-------+-----------+-----------+------------+------------+  Right   1.13  0.88                                   +-------+-----------+-----------+------------+------------+  Left    1.09        0.92                                   +-------+-----------+-----------+------------+------------+    PT did not want Pressure taken on RUE due to recent injury.    Preliminary    ECHOCARDIOGRAM COMPLETE  Result Date: 05/27/2021    ECHOCARDIOGRAM REPORT   Patient Name:   [name] Date of Exam: 05/27/2021 Medical Rec #:  [id number]    Height:       75.0 in Accession #:    [id number]   Weight:       167.5 lb Date of Birth:  [birth year]    BSA:          2.035 m Patient Age:    31 years     BP:           142/78 mmHg Patient Gender: M            HR:           79 bpm. Exam Location:  Inpatient Procedure: 2D Echo, Cardiac Doppler and Color Doppler Indications:    Bacteremia R78.81  History:        Patient has no prior history of Echocardiogram examinations.                 Risk Factors:Diabetes.  Sonographer:    Bernadene Person RDCS Referring Phys: [id number] [place]  1. Left ventricular ejection fraction, by estimation, is 55 to 60%. The left ventricle has normal function. The left ventricle has no regional wall motion abnormalities. Left ventricular diastolic function could not be evaluated.  2.  Right ventricular systolic function is normal. The right ventricular size is normal.  3. The mitral valve is normal in structure. No evidence of mitral valve regurgitation. No evidence of mitral stenosis.  4. The aortic valve is normal in structure. Aortic valve regurgitation is not visualized. No aortic stenosis is present.  5. Evidence of atrial level shunting detected by color flow Doppler. FINDINGS  Left Ventricle: Left ventricular ejection fraction, by estimation, is 55 to 60%. The left ventricle has normal function. The left ventricle has no regional wall motion abnormalities. The left ventricular internal cavity size was normal in size. There is  no left ventricular hypertrophy. Left ventricular diastolic function could not be evaluated. Right Ventricle: The right ventricular size is normal. Right vetricular wall thickness was not well visualized. Right ventricular systolic function is normal. Left Atrium: Left atrial size was normal in size. Right Atrium: Right atrial size was normal in size. Pericardium: There is no evidence of pericardial effusion. Mitral Valve: The mitral valve is normal in structure. No evidence of mitral valve regurgitation. No evidence of mitral valve stenosis. There is no evidence of mitral valve vegetation. Tricuspid Valve: The tricuspid valve is normal in structure. Tricuspid valve regurgitation is trivial. Aortic Valve: The aortic valve is normal in structure. Aortic valve regurgitation is not visualized. No aortic stenosis is present. Pulmonic Valve: The pulmonic valve was normal in structure. Pulmonic valve regurgitation is not visualized. Aorta: The aortic root and ascending aorta are structurally normal, with no evidence of dilitation.  IAS/Shunts: Evidence of atrial level shunting detected by color flow Doppler.  LEFT VENTRICLE PLAX 2D LVIDd:         5.00 cm LVIDs:         3.70 cm LV PW:         1.00 cm LV IVS:        0.90 cm LVOT diam:     2.10 cm LV SV:         57 LV SV  Index:   28 LVOT Area:     3.46 cm  LV Volumes (MOD) LV vol d, MOD A2C: 130.0 ml LV vol d, MOD A4C: 97.7 ml LV vol s, MOD A2C: 57.8 ml LV vol s, MOD A4C: 44.7 ml LV SV MOD A2C:     72.2 ml LV SV MOD A4C:     97.7 ml LV SV MOD BP:      66.3 ml RIGHT VENTRICLE RV S prime:     11.00 cm/s TAPSE (M-mode): 2.0 cm LEFT ATRIUM             Index        RIGHT ATRIUM           Index LA diam:        3.10 cm 1.52 cm/m   RA Area:     14.20 cm LA Vol (A2C):   32.1 ml 15.77 ml/m  RA Volume:   36.60 ml  17.98 ml/m LA Vol (A4C):   33.2 ml 16.31 ml/m LA Biplane Vol: 33.3 ml 16.36 ml/m  AORTIC VALVE LVOT Vmax:   90.80 cm/s LVOT Vmean:  56.100 cm/s LVOT VTI:    0.166 m  AORTA Ao Root diam: 3.00 cm MITRAL VALVE MV Area (PHT): 4.17 cm    SHUNTS MV Decel Time: 182 msec    Systemic VTI:  0.17 m MV E velocity: 67.70 cm/s  Systemic Diam: 2.10 cm MV A velocity: 61.30 cm/s MV E/A ratio:  1.10 Mertie Moores MD Electronically signed by Mertie Moores MD Signature Date/Time: 05/27/2021/3:14:59 PM    Final    - pertinent xrays, CT, MRI studies were reviewed and independently interpreted  Positive ROS: All other systems have been reviewed and were otherwise negative with the exception of those mentioned in the HPI and as above.  Physical Exam: General: Alert, no acute distress Psychiatric: Patient is competent for consent with normal mood and affect Lymphatic: No axillary or cervical lymphadenopathy Cardiovascular: No pedal edema Respiratory: No cyanosis, no use of accessory musculature GI: No organomegaly, abdomen is soft and non-tender    Images:  @ENCIMAGES @  Labs:  Lab Results  Component Value Date   HGBA1C 9.0 (H) 05/26/2021   HGBA1C 9.2 (A) 06/21/2019   HGBA1C 10.3 (H) 11/06/2013   ESRSEDRATE 3 11/05/2013   REPTSTATUS PENDING 05/26/2021   CULT  05/26/2021    NO GROWTH < 24 HOURS Performed at [facility], [place] [address] [place], [place] [address]     Lab Results  Component Value Date   ALBUMIN  2.4 (L) 05/26/2021   ALBUMIN 4.5 11/05/2013   ALBUMIN 3.5 11/15/2012     CBC EXTENDED Latest Ref Rng & Units 05/27/2021 10/21/2020 10/20/2020  WBC 4.0 - 10.5 K/uL 10.7(H) 5.0 4.8  RBC 4.22 - 5.81 MIL/uL 3.89(L) 4.05(L) 4.18(L)  HGB 13.0 - 17.0 g/dL 11.5(L) 12.3(L) 12.8(L)  HCT 39.0 - 52.0 % 34.4(L) 37.5(L) 38.0(L)  PLT 150 - 400 K/uL 400 228 244  NEUTROABS 1.7 - 7.7 K/uL - -  2.7  LYMPHSABS 0.7 - 4.0 K/uL - - 1.4    Neurologic: Patient does not have protective sensation bilateral lower extremities.   MUSCULOSKELETAL:   Skin: Examination patient has cellulitis and purulent drainage from the left heel.  Patient has a strong dorsalis pedis pulse.  Review of the MRI scan shows osteomyelitis involving the entire calcaneus.  Patient's white blood cell count is elevated at 10.7.  Hemoglobin stable at 11.5.  Patient has severe protein caloric malnutrition with an albumin of 2.4.  Hemoglobin A1c 9.0.  Assessment: Assessment: Abscess ulceration and osteomyelitis of the left calcaneus.  Plan: Discussed with the patient recommendation to proceed with a transtibial amputation.  Postoperative course was discussed risk and benefits were discussed.  Patient states he understands wished to proceed at this time.  Anticipate patient could be discharged to inpatient rehab.  Plan for surgery tomorrow morning at 9 AM.  I also discussed with the patient that I will be out of town starting Wednesday.  Thank you for the consult and the opportunity to see Mr. [name], [place] [phone] 6:18 PM

## 2021-05-27 NOTE — Progress Notes (Signed)
°  Echocardiogram 2D Echocardiogram has been performed.  [name] 05/27/2021, 1:44 PM

## 2021-05-27 NOTE — Consult Note (Signed)
[place] for Infectious Disease    Date of Admission:  05/26/2021     Reason for Consult: Calcaneal OM/ Staph aureus bacteremia     Referring Physician: Dr Rogers Blocker  Current antibiotics: Vancomycin 2/19-present Cefepime 2/19-present Metronidazole 2/19-present  ASSESSMENT:    51 y.o. male admitted with:  Left calcaneus osteomyelitis and abscess: Complicated by MSSA bacteremia noted at the outside hospital. MSSA bacteremia: Noted on blood cultures at the outside hospital.  Repeat blood cultures obtained here. Uncontrolled diabetes: Hemoglobin A1c is 9.0. Sepsis: Secondary to #1 and #2. Right shoulder pain: Reportedly twisted his arm last week with now decreased ROM.  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    Pernicious anemia   Right shoulder pain   Essential hypertension   Sepsis due to methicillin susceptible Staphylococcus aureus (H[facility])   MEDICATIONS:    Scheduled Meds:  diclofenac Sodium  4 g Topical QID   ferrous sulfate  324 mg Oral Q breakfast   insulin aspart  0-20 Units Subcutaneous TID WC   insulin aspart  0-5 Units Subcutaneous QHS   insulin detemir  14 Units Subcutaneous QHS   losartan  25 mg Oral Daily   melatonin  5 mg Oral QHS   sodium chloride flush  3 mL Intravenous Q12H   vitamin B-12  1,000 mcg Oral Daily   Continuous Infusions:  sodium chloride     ceFEPime  (MAXIPIME) IV 2 g (05/27/21 1305)   metronidazole 500 mg (05/27/21 0816)   PRN Meds:.sodium chloride, acetaminophen **OR** acetaminophen, ondansetron **OR** ondansetron (ZOFRAN) IV, oxyCODONE, sodium chloride flush  HPI:    [name] is a 51 y.o. male with a past medical history significant for diabetes, hypertension who presented overnight to [facility] as a transfer [facility].  When patient presented to [facility] he was noted to be febrile with significant hyperglycemia.  He had an abscess on his left calcaneus that was incised and drained with approximately 300 cc of pus.  Culture and Gram stain was apparently obtained from this fluid.  Patient also had blood cultures drawn which are growing gram-positive cocci.  MSSA was detected by PCR at the outside hospital.  Left foot x-rays obtained showed marked severity of soft tissue swelling involving the left heel with extensive cortical destruction of the left calcaneus consistent with extensive osteomyelitis.  Patient has previously followed with Dr. 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  Patient also reports a history of surgery on his left heel in Feb 2022 that healed without issues.  He was seen by Dr Gale Journey at that time for calcaneal OM in setting of longstanding heel ulcer.  Cultures at that time grew MSSA and Proteus vulgaris per Dr Hart [place] note 07/03/20.    He reports that this current issue developed fairly abruptly about 2 days ago he pushed on his left heel and it felt "mushy".  He also had some mild erythema.  This is what prompted him to  present to the emergency department.  Despite being febrile at the ER he reported no fevers or chills.  No chest pain, no shortness of breath, no cough, no abdominal pain.  No vomiting or diarrhea.  He does report some mild nausea.  He also endorses right shoulder pain for approximately 1 week after he fell off a railing.  He was seen in the emergency  department for this issue as well with negative x-rays.  He reports pain with movement.  We have been consulted for further recommendations in the setting of calcaneal osteomyelitis complicated by Staph aureus bacteremia.  Upon arrival to [facility], patient was noted to be afebrile with stable blood pressure and heart rate.  Preliminary lab work has been obtained showing a hemoglobin A1c of 9.0, sodium 130, glucose 394, creatinine 0.85, normal LFTs.  Repeat blood cultures were obtained and are currently pending.  He has a mild leukocytosis of 10.7 and platelets of 400.  He underwent MRI of the left foot last night that showed extensive infection.   Past Medical History:  Diagnosis Date   Diabetes mellitus without complication ([place])    Hypertension     Social History   Tobacco Use   Smoking status: Former   Smokeless tobacco: Never  Substance Use Topics   Alcohol use: No   Drug use: No    Family History  Problem Relation Age of Onset   Diabetes Mellitus II Sister     Allergies  Allergen Reactions   Piperacillin Hives   Tazobactam Hives    Review of Systems  All other systems reviewed and are negative.  Except as noted above in the HPI.  OBJECTIVE:   Blood pressure 122/78, pulse 81, temperature 97.8 F (36.6 C), temperature source Oral, resp. rate 18, height 6\' 3"  (1.905 m), weight 76 kg, SpO2 99 %. Body mass index is 20.94 kg/m.  Physical Exam Constitutional:      General: He is not in acute distress.    Comments: Chronically ill appearing.   HENT:     Head: Normocephalic and atraumatic.     Mouth/Throat:     Comments: Poor dentition Eyes:     Extraocular Movements: Extraocular movements intact.     Conjunctiva/sclera: Conjunctivae normal.  Cardiovascular:     Rate and Rhythm: Normal rate and regular rhythm.  Pulmonary:     Effort: Pulmonary effort is normal. No respiratory distress.     Breath sounds: Normal breath sounds.  Abdominal:     General:  There is no distension.     Palpations: Abdomen is soft.     Tenderness: There is no abdominal tenderness.  Musculoskeletal:        General: Swelling and deformity present. Normal range of motion.     Cervical back: Normal range of motion and neck supple.     Comments: Large left heel ulcer draining with Charcot deformity.  S/p prior toe amputation.  Skin:    General: Skin is warm and dry.     Findings: No rash.  Neurological:     General: No focal deficit present.     Mental Status: He is alert and oriented to person, place, and time.  Psychiatric:        Mood and Affect: Mood normal.        Behavior: Behavior normal.     Lab Results: Lab Results  Component Value Date   WBC 10.7 (H) 05/27/2021   HGB 11.5 (L) 05/27/2021   HCT 34.4 (  L) 05/27/2021   MCV 88.4 05/27/2021   PLT 400 05/27/2021    Lab Results  Component Value Date   NA 133 (L) 05/27/2021   K 4.2 05/27/2021   CO2 22 05/27/2021   GLUCOSE 324 (H) 05/27/2021   BUN 14 05/27/2021   CREATININE 0.89 05/27/2021   CALCIUM 8.5 (L) 05/27/2021   GFRNONAA >60 05/27/2021   GFRAA >60 11/06/2013    Lab Results  Component Value Date   ALT 17 05/26/2021   AST 20 05/26/2021   ALKPHOS 110 05/26/2021   BILITOT 0.6 05/26/2021    No results found for: CRP     Component Value Date/Time   ESRSEDRATE 3 11/05/2013 1141    I have reviewed the micro and lab results in Epic.  Imaging: MR FOOT LEFT WO CONTRAST  Result Date: 05/27/2021 CLINICAL DATA:  Evaluate for osteomyelitis of the foot. History of diabetes. Transfer from [facility]. Left calcaneal abscess with approximally 300 mL of pus drainage. Fever of 103. Gram-positive cocci staph aureus detected by PCR. Status post surgery on left heel 05/2019. EXAM: MRI OF THE LEFT FOOT WITHOUT CONTRAST TECHNIQUE: Multiplanar, multisequence MR imaging of the left hindfoot was performed. No intravenous contrast was administered. COMPARISON:  left foot radiographs 05/26/2021  11/01/2020; MRI left forefoot 08/28/2020 MRI left hindfoot 05/29/2020 FINDINGS: Bones/Joint/Cartilage Postsurgical changes are again seen on prior posteroinferior calcaneus resection. There is increased erosion compared to 05/29/2020 within the posteroinferior aspect of the calcaneus. There is moderate diffuse marrow edema throughout the calcaneus greatest within the posterior plantar aspect radius findings are suspicious for active osteomyelitis. Moderate dorsal talonavicular degenerative osteophytosis. Severe navicular-cuneiform diffuse cartilage thinning and joint space narrowing. New moderate subchondral marrow edema within the proximal aspect of the lateral cuneiform. There are again small tibiotalar and posterior subtalar joint effusions. This again raises suspicion for septic arthritis. Ligaments The medial and lateral ankle ligaments are intact. The Lisfranc ligament complex is intact. Muscles and Tendons There is moderate edema within the plantar foot musculature nonspecific myositis. There appears to be new mild connection of the distal Achilles tendon in the region of the prior full-thickness tear or surgical release. This may be secondary to scarring. Soft tissues There is diffuse edema and moderate soft tissue swelling throughout the posterior plantar hindfoot soft tissues consistent with the prior ulceration and postsurgical changes. There is scattered low signal with blooming artifact within the posterior plantar medial soft tissues. Some of the blooming artifact is compatible with metallic postsurgical change however this raises suspicion for air from a gas producing organism, especially as no definite metallic density is seen in this region on recent 05/26/2021 radiographs. There is decreased T1 and decreased T2 soft tissue signal in a somewhat bandlike configuration in the region of the prior complete soft tissue ulceration previously completely contact of the posteroinferior calcaneus on  05/29/2020 prior MRI. This is compatible with scarring in the region of the prior sinus tract/surgical wound dehiscence, however the edema and air suggests infectious cellulitis that contacts the posteroinferior plantar aspect of the remaining calcaneus. There are also small areas of fluid signal seen plantar to the remaining hindfoot flexor digitorum brevis musculature and deep to the abductor hallucis musculature. Otherwise no large abscess is visualized. IMPRESSION:: IMPRESSION: 1. Status post partial resection of the posteroinferior aspect of the calcaneus for osteomyelitis. Increased erosion of the posteroinferior aspect of the calcaneus compared to 05/29/2020 prior MRI. There is also extensive marrow edema throughout the calcaneus. Findings are concerning for active/acute osteomyelitis.  2. Some scarring in the prior of the ulceration or surgical wound dehiscence at the posterior plantar aspect of the left heel. Surrounding likely infectious cellulitis with edema swelling and artifact suspicious for a gas producing organism. 3. Small tibiotalar and posterior subtalar joint effusions again concerning for septic arthritis. 4. Interval scarring of the prior full-thickness distal Achilles tendon tear with mild new tendon continuity. 5. Diffuse edema is again seen throughout the intrinsic foot musculature compatible with nonspecific myositis. Electronically Signed   By: Yvonne Kendall M.D.   On: 05/27/2021 08:33     Imaging independently reviewed in Epic.  [facility]ious [facility] [phone] pager 05/27/2021, 1:08 PM

## 2021-05-27 NOTE — Progress Notes (Addendum)
Inpatient Diabetes Program Recommendations  AACE/ADA: New Consensus Statement on Inpatient Glycemic Control (2015)  Target Ranges:  Prepandial:   less than 140 mg/dL      Peak postprandial:   less than 180 mg/dL (1-2 hours)      Critically ill patients:  140 - 180 mg/dL   Lab Results  Component Value Date   GLUCAP 279 (H) 05/27/2021   HGBA1C 9.0 (H) 05/26/2021    Review of Glycemic Control  Latest Reference Range & Units 05/26/21 17:21 05/26/21 19:41 05/26/21 23:04 05/27/21 02:44 05/27/21 05:55 05/27/21 07:53 05/27/21 09:08  Glucose-Capillary 70 - 99 mg/dL 166 (H) 063 (H) 016 (H) 305 (H) 241 (H) 241 (H) 279 (H)   Diabetes history: DM 2 Outpatient Diabetes medications: Levemir 28 units, Humalog 0-15 units Current orders for Inpatient glycemic control:  Levemir 14 units qhs Novolog 0-20 units tid + hs  A1c 9% on 2/19  Inpatient Diabetes Program Recommendations:    Pt received Levemir 28 units yesterday and glucose trends still above goal.  -   Increase Levemir to 34 units  Spoke with pt at bedside regarding A1c of 9% and glucose control at home. Pt reports going to PCP (Dr. Sarah [place]) every 3-6 months for check ups and glucose control. Pt says he is due for another check up. Pt reports checking his glucose "like 20 times a day". Discussed current A1c. Described glucose and A1c goals. Encouraged pt to contact PCP for insulin titration when he sees his glucose trends increase. However, pt has issues with labile glucose trends at home. Pt had questions about insulin pumps and the initiation of them. Pt wanted to know how they work. Pt maybe a good candidate for an insulin pump due to labile glucose trends. Encouraged PCP follow up and referral for Endocrinologist for insulin pump initiation. Pt had other questions related to DM and they were answered.  Thanks,  Christena Deem RN, MSN, BC-ADM Inpatient Diabetes Coordinator Team Pager [phone] (8a-5p)

## 2021-05-28 ENCOUNTER — Encounter (HOSPITAL_COMMUNITY): Payer: Self-pay | Admitting: Family Medicine

## 2021-05-28 ENCOUNTER — Inpatient Hospital Stay (HOSPITAL_COMMUNITY)
Admission: AD | Disposition: A | Discharge status: Home Health | Payer: Self-pay | Source: Other Acute Inpatient Hospital | Attending: Internal Medicine

## 2021-05-28 ENCOUNTER — Other Ambulatory Visit: Payer: Self-pay

## 2021-05-28 ENCOUNTER — Inpatient Hospital Stay (HOSPITAL_COMMUNITY): Payer: Medicaid Other | Admitting: Anesthesiology

## 2021-05-28 DIAGNOSIS — I10 Essential (primary) hypertension: Secondary | ICD-10-CM

## 2021-05-28 DIAGNOSIS — M869 Osteomyelitis, unspecified: Secondary | ICD-10-CM

## 2021-05-28 DIAGNOSIS — L02612 Cutaneous abscess of left foot: Secondary | ICD-10-CM

## 2021-05-28 DIAGNOSIS — E1169 Type 2 diabetes mellitus with other specified complication: Secondary | ICD-10-CM

## 2021-05-28 HISTORY — PX: AMPUTATION: SHX166

## 2021-05-28 LAB — CBC
HCT: 35.8 % — ABNORMAL LOW (ref 39.0–52.0)
Hemoglobin: 12.2 g/dL — ABNORMAL LOW (ref 13.0–17.0)
MCH: 30.3 pg (ref 26.0–34.0)
MCHC: 34.1 g/dL (ref 30.0–36.0)
MCV: 88.8 fL (ref 80.0–100.0)
Platelets: 454 10*3/uL — ABNORMAL HIGH (ref 150–400)
RBC: 4.03 MIL/uL — ABNORMAL LOW (ref 4.22–5.81)
RDW: 11.5 % (ref 11.5–15.5)
WBC: 8.5 10*3/uL (ref 4.0–10.5)
nRBC: 0 % (ref 0.0–0.2)

## 2021-05-28 LAB — SURGICAL PCR SCREEN
MRSA, PCR: NEGATIVE
Staphylococcus aureus: POSITIVE — AB

## 2021-05-28 LAB — BASIC METABOLIC PANEL
Anion gap: 9 (ref 5–15)
BUN: 15 mg/dL (ref 6–20)
CO2: 27 mmol/L (ref 22–32)
Calcium: 8.7 mg/dL — ABNORMAL LOW (ref 8.9–10.3)
Chloride: 100 mmol/L (ref 98–111)
Creatinine, Ser: 0.6 mg/dL — ABNORMAL LOW (ref 0.61–1.24)
GFR, Estimated: >60 mL/min (ref >60)
Glucose, Bld: 207 mg/dL — ABNORMAL HIGH (ref 70–99)
Potassium: 3.8 mmol/L (ref 3.5–5.1)
Sodium: 136 mmol/L (ref 135–145)

## 2021-05-28 LAB — GLUCOSE, CAPILLARY
Glucose-Capillary: 240 mg/dL — ABNORMAL HIGH (ref 70–99)
Glucose-Capillary: 166 mg/dL — ABNORMAL HIGH (ref 70–99)
Glucose-Capillary: 187 mg/dL — ABNORMAL HIGH (ref 70–99)
Glucose-Capillary: 226 mg/dL — ABNORMAL HIGH (ref 70–99)
Glucose-Capillary: 284 mg/dL — ABNORMAL HIGH (ref 70–99)

## 2021-05-28 LAB — RESP PANEL BY RT-PCR (FLU A&B, COVID) ARPGX2
Influenza A by PCR: NEGATIVE
Influenza B by PCR: NEGATIVE
SARS Coronavirus 2 by RT PCR: NEGATIVE

## 2021-05-28 SURGERY — AMPUTATION BELOW KNEE
Anesthesia: General | Site: Knee | Laterality: Left

## 2021-05-28 MED ORDER — POVIDONE-IODINE 10 % EX SWAB: 2.0000 "application " | Freq: Once | CUTANEOUS | Status: DC

## 2021-05-28 MED ORDER — METOPROLOL TARTRATE 5 MG/5ML IV SOLN: 2.0000 mg | INTRAVENOUS | Status: DC | PRN

## 2021-05-28 MED ORDER — CHLORHEXIDINE GLUCONATE 0.12 % MT SOLN: 15.0000 mL | Freq: Once | OROMUCOSAL | Status: AC

## 2021-05-28 MED ORDER — HYDRALAZINE HCL 20 MG/ML IJ SOLN: 5.0000 mg | INTRAMUSCULAR | Status: DC | PRN

## 2021-05-28 MED ORDER — MIDAZOLAM HCL 2 MG/2ML IJ SOLN
INTRAMUSCULAR | Status: AC
  Filled 2021-05-28: qty 2

## 2021-05-28 MED ORDER — INSULIN ASPART 100 UNIT/ML IJ SOLN: 0.0000 [IU] | INTRAMUSCULAR | Status: DC | PRN

## 2021-05-28 MED ORDER — LACTATED RINGERS IV SOLN: INTRAVENOUS | Status: DC

## 2021-05-28 MED ORDER — KETAMINE HCL 50 MG/5ML IJ SOSY
PREFILLED_SYRINGE | INTRAMUSCULAR | Status: AC
  Filled 2021-05-28: qty 5

## 2021-05-28 MED ORDER — PHENYLEPHRINE 40 MCG/ML (10ML) SYRINGE FOR IV PUSH (FOR BLOOD PRESSURE SUPPORT)
PREFILLED_SYRINGE | INTRAVENOUS | Status: AC
  Filled 2021-05-28: qty 10

## 2021-05-28 MED ORDER — HYDROMORPHONE HCL 1 MG/ML IJ SOLN: 0.2500 mg | INTRAMUSCULAR | Status: DC | PRN

## 2021-05-28 MED ORDER — MAGNESIUM CITRATE PO SOLN
1.0000 | Freq: Once | ORAL | Status: DC | PRN
  Filled 2021-05-28: qty 296

## 2021-05-28 MED ORDER — HYDROMORPHONE HCL 1 MG/ML IJ SOLN
0.5000 mg | INTRAMUSCULAR | Status: DC | PRN
  Administered 2021-05-28 – 2021-05-30 (×9): 1 mg via INTRAVENOUS
  Filled 2021-05-28 (×9): qty 1

## 2021-05-28 MED ORDER — MAGNESIUM SULFATE 2 GM/50ML IV SOLN: 2.0000 g | Freq: Every day | INTRAVENOUS | Status: DC | PRN

## 2021-05-28 MED ORDER — PROPOFOL 10 MG/ML IV BOLUS
INTRAVENOUS | Status: DC | PRN
  Administered 2021-05-28: 200 mg via INTRAVENOUS

## 2021-05-28 MED ORDER — FENTANYL CITRATE (PF) 100 MCG/2ML IJ SOLN
INTRAMUSCULAR | Status: AC
  Filled 2021-05-28: qty 2

## 2021-05-28 MED ORDER — LIDOCAINE HCL (CARDIAC) PF 100 MG/5ML IV SOSY
PREFILLED_SYRINGE | INTRAVENOUS | Status: DC | PRN
  Administered 2021-05-28: 100 mg via INTRAVENOUS

## 2021-05-28 MED ORDER — LIDOCAINE 2% (20 MG/ML) 5 ML SYRINGE
INTRAMUSCULAR | Status: AC
Start: 2021-05-28 — End: ?
  Filled 2021-05-28: qty 5

## 2021-05-28 MED ORDER — MUPIROCIN 2 % EX OINT
1.0000 "application " | TOPICAL_OINTMENT | Freq: Two times a day (BID) | CUTANEOUS | Status: DC
  Administered 2021-05-28 – 2021-05-30 (×4): 1 via NASAL
  Filled 2021-05-28 (×3): qty 22

## 2021-05-28 MED ORDER — PHENOL 1.4 % MT LIQD: 1.0000 | OROMUCOSAL | Status: DC | PRN

## 2021-05-28 MED ORDER — DOCUSATE SODIUM 100 MG PO CAPS
100.0000 mg | ORAL_CAPSULE | Freq: Every day | ORAL | Status: DC
  Administered 2021-05-29 – 2021-05-30 (×2): 100 mg via ORAL
  Filled 2021-05-28 (×2): qty 1

## 2021-05-28 MED ORDER — FENTANYL CITRATE (PF) 250 MCG/5ML IJ SOLN
INTRAMUSCULAR | Status: AC
  Filled 2021-05-28: qty 5

## 2021-05-28 MED ORDER — ACETAMINOPHEN 325 MG PO TABS: 325.0000 mg | ORAL_TABLET | Freq: Four times a day (QID) | ORAL | Status: DC | PRN

## 2021-05-28 MED ORDER — ONDANSETRON HCL 4 MG/2ML IJ SOLN
INTRAMUSCULAR | Status: AC
  Filled 2021-05-28: qty 2

## 2021-05-28 MED ORDER — GUAIFENESIN-DM 100-10 MG/5ML PO SYRP: 15.0000 mL | ORAL_SOLUTION | ORAL | Status: DC | PRN

## 2021-05-28 MED ORDER — SODIUM CHLORIDE 0.9 % IV SOLN: INTRAVENOUS | Status: DC

## 2021-05-28 MED ORDER — MIDAZOLAM HCL 5 MG/5ML IJ SOLN
INTRAMUSCULAR | Status: DC | PRN
  Administered 2021-05-28: 2 mg via INTRAVENOUS

## 2021-05-28 MED ORDER — TRANEXAMIC ACID-NACL 1000-0.7 MG/100ML-% IV SOLN
INTRAVENOUS | Status: AC
  Filled 2021-05-28: qty 100

## 2021-05-28 MED ORDER — 0.9 % SODIUM CHLORIDE (POUR BTL) OPTIME
TOPICAL | Status: DC | PRN
  Administered 2021-05-28: 1000 mL

## 2021-05-28 MED ORDER — ASCORBIC ACID 500 MG PO TABS
1000.0000 mg | ORAL_TABLET | Freq: Every day | ORAL | Status: DC
  Administered 2021-05-28 – 2021-05-30 (×3): 1000 mg via ORAL
  Filled 2021-05-28 (×3): qty 2

## 2021-05-28 MED ORDER — BISACODYL 5 MG PO TBEC: 5.0000 mg | DELAYED_RELEASE_TABLET | Freq: Every day | ORAL | Status: DC | PRN

## 2021-05-28 MED ORDER — KETAMINE HCL 50 MG/ML IJ SOLN
INTRAMUSCULAR | Status: DC | PRN
  Administered 2021-05-28 (×2): 10 mg via INTRAMUSCULAR

## 2021-05-28 MED ORDER — PANTOPRAZOLE SODIUM 40 MG PO TBEC
40.0000 mg | DELAYED_RELEASE_TABLET | Freq: Every day | ORAL | Status: DC
  Administered 2021-05-28 – 2021-05-30 (×3): 40 mg via ORAL
  Filled 2021-05-28 (×3): qty 1

## 2021-05-28 MED ORDER — POLYETHYLENE GLYCOL 3350 17 G PO PACK: 17.0000 g | PACK | Freq: Every day | ORAL | Status: DC | PRN

## 2021-05-28 MED ORDER — CHLORHEXIDINE GLUCONATE 0.12 % MT SOLN
OROMUCOSAL | Status: AC
  Administered 2021-05-28: 15 mL via OROMUCOSAL
  Filled 2021-05-28: qty 15

## 2021-05-28 MED ORDER — OXYCODONE HCL 5 MG PO TABS
10.0000 mg | ORAL_TABLET | ORAL | Status: DC | PRN
  Administered 2021-05-28 – 2021-05-30 (×7): 15 mg via ORAL
  Filled 2021-05-28 (×7): qty 3

## 2021-05-28 MED ORDER — ALUM & MAG HYDROXIDE-SIMETH 200-200-20 MG/5ML PO SUSP: 15.0000 mL | ORAL | Status: DC | PRN

## 2021-05-28 MED ORDER — PHENYLEPHRINE 40 MCG/ML (10ML) SYRINGE FOR IV PUSH (FOR BLOOD PRESSURE SUPPORT)
PREFILLED_SYRINGE | INTRAVENOUS | Status: DC | PRN
  Administered 2021-05-28 (×2): 40 ug via INTRAVENOUS

## 2021-05-28 MED ORDER — ORAL CARE MOUTH RINSE: 15.0000 mL | Freq: Once | OROMUCOSAL | Status: AC

## 2021-05-28 MED ORDER — POTASSIUM CHLORIDE CRYS ER 20 MEQ PO TBCR: 20.0000 meq | EXTENDED_RELEASE_TABLET | Freq: Every day | ORAL | Status: DC | PRN

## 2021-05-28 MED ORDER — OXYCODONE HCL 5 MG PO TABS: 5.0000 mg | ORAL_TABLET | ORAL | Status: DC | PRN

## 2021-05-28 MED ORDER — CHLORHEXIDINE GLUCONATE 4 % EX LIQD: 60.0000 mL | Freq: Once | CUTANEOUS | Status: DC

## 2021-05-28 MED ORDER — ZINC SULFATE 220 (50 ZN) MG PO CAPS
220.0000 mg | ORAL_CAPSULE | Freq: Every day | ORAL | Status: DC
  Administered 2021-05-28 – 2021-05-30 (×3): 220 mg via ORAL
  Filled 2021-05-28 (×3): qty 1

## 2021-05-28 MED ORDER — CEFAZOLIN SODIUM-DEXTROSE 2-4 GM/100ML-% IV SOLN
2.0000 g | Freq: Three times a day (TID) | INTRAVENOUS | Status: DC
Start: 2021-05-28 — End: 2021-05-30
  Administered 2021-05-28 – 2021-05-30 (×7): 2 g via INTRAVENOUS
  Filled 2021-05-28 (×7): qty 100

## 2021-05-28 MED ORDER — DIPHENHYDRAMINE HCL 12.5 MG/5ML PO ELIX: 12.5000 mg | ORAL_SOLUTION | ORAL | Status: DC | PRN

## 2021-05-28 MED ORDER — ONDANSETRON HCL 4 MG/2ML IJ SOLN: 4.0000 mg | Freq: Four times a day (QID) | INTRAMUSCULAR | Status: DC | PRN

## 2021-05-28 MED ORDER — TRANEXAMIC ACID-NACL 1000-0.7 MG/100ML-% IV SOLN
1000.0000 mg | INTRAVENOUS | Status: AC
  Administered 2021-05-28: 1000 mg via INTRAVENOUS

## 2021-05-28 MED ORDER — FENTANYL CITRATE (PF) 100 MCG/2ML IJ SOLN
25.0000 ug | INTRAMUSCULAR | Status: DC | PRN
  Administered 2021-05-28 (×2): 50 ug via INTRAVENOUS
  Administered 2021-05-28 (×2): 25 ug via INTRAVENOUS

## 2021-05-28 MED ORDER — PROPOFOL 10 MG/ML IV BOLUS
INTRAVENOUS | Status: AC
Start: 2021-05-28 — End: ?
  Filled 2021-05-28: qty 20

## 2021-05-28 MED ORDER — INSULIN DETEMIR 100 UNIT/ML ~~LOC~~ SOLN
20.0000 [IU] | Freq: Every day | SUBCUTANEOUS | Status: DC
  Administered 2021-05-28: 20 [IU] via SUBCUTANEOUS
  Filled 2021-05-28 (×2): qty 0.2

## 2021-05-28 MED ORDER — JUVEN PO PACK
1.0000 | PACK | Freq: Two times a day (BID) | ORAL | Status: DC
  Administered 2021-05-28 – 2021-05-30 (×5): 1 via ORAL
  Filled 2021-05-28 (×6): qty 1

## 2021-05-28 MED ORDER — LABETALOL HCL 5 MG/ML IV SOLN: 10.0000 mg | INTRAVENOUS | Status: DC | PRN

## 2021-05-28 SURGICAL SUPPLY — 38 items
BAG COUNTER SPONGE SURGICOUNT (BAG) IMPLANT
BAG SPNG CNTER NS LX DISP (BAG)
BLADE SAW RECIP 87.9 MT (BLADE) ×3 IMPLANT
BLADE SURG 21 STRL SS (BLADE) ×3 IMPLANT
BNDG COHESIVE 6X5 TAN STRL LF (GAUZE/BANDAGES/DRESSINGS) ×1 IMPLANT
CANISTER WOUND CARE 500ML ATS (WOUND CARE) ×3 IMPLANT
COVER SURGICAL LIGHT HANDLE (MISCELLANEOUS) ×3 IMPLANT
CUFF TOURN SGL QUICK 34 (TOURNIQUET CUFF) ×2
CUFF TRNQT CYL 34X4.125X (TOURNIQUET CUFF) ×2 IMPLANT
DRAPE DERMATAC (DRAPES) ×2 IMPLANT
DRAPE INCISE IOBAN 66X45 STRL (DRAPES) ×3 IMPLANT
DRAPE U-SHAPE 47X51 STRL (DRAPES) ×3 IMPLANT
DRESSING PREVENA PLUS CUSTOM (GAUZE/BANDAGES/DRESSINGS) ×2 IMPLANT
DRSG PREVENA PLUS CUSTOM (GAUZE/BANDAGES/DRESSINGS) ×2
DURAPREP 26ML APPLICATOR (WOUND CARE) ×3 IMPLANT
ELECT REM PT RETURN 9FT ADLT (ELECTROSURGICAL) ×2
ELECTRODE REM PT RTRN 9FT ADLT (ELECTROSURGICAL) ×2 IMPLANT
GLOVE SURG ORTHO LTX SZ9 (GLOVE) ×3 IMPLANT
GLOVE SURG UNDER POLY LF SZ9 (GLOVE) ×3 IMPLANT
GOWN STRL REUS W/ TWL XL LVL3 (GOWN DISPOSABLE) ×4 IMPLANT
GOWN STRL REUS W/TWL XL LVL3 (GOWN DISPOSABLE) ×4
KIT BASIN OR (CUSTOM PROCEDURE TRAY) ×3 IMPLANT
KIT TURNOVER KIT B (KITS) ×3 IMPLANT
MANIFOLD NEPTUNE II (INSTRUMENTS) ×2 IMPLANT
NS IRRIG 1000ML POUR BTL (IV SOLUTION) ×3 IMPLANT
PACK ORTHO EXTREMITY (CUSTOM PROCEDURE TRAY) ×3 IMPLANT
PAD ARMBOARD 7.5X6 YLW CONV (MISCELLANEOUS) ×3 IMPLANT
PREVENA RESTOR ARTHOFORM 46X30 (CANNISTER) ×4 IMPLANT
SPONGE T-LAP 18X18 ~~LOC~~+RFID (SPONGE) ×1 IMPLANT
STAPLER VISISTAT 35W (STAPLE) ×1 IMPLANT
STOCKINETTE IMPERVIOUS LG (DRAPES) ×3 IMPLANT
SUT ETHILON 2 0 PSLX (SUTURE) IMPLANT
SUT SILK 2 0 (SUTURE) ×2
SUT SILK 2-0 18XBRD TIE 12 (SUTURE) ×2 IMPLANT
SUT VIC AB 1 CTX 27 (SUTURE) ×6 IMPLANT
TOWEL GREEN STERILE (TOWEL DISPOSABLE) ×3 IMPLANT
TUBE CONNECTING 12X1/4 (SUCTIONS) ×3 IMPLANT
YANKAUER SUCT BULB TIP NO VENT (SUCTIONS) ×3 IMPLANT

## 2021-05-28 NOTE — Anesthesia Postprocedure Evaluation (Signed)
Anesthesia Post Note  Patient: [name]  Procedure(s) Performed: LEFT BELOW KNEE AMPUTATION (Left: Knee)     Patient location during evaluation: PACU Anesthesia Type: General Level of consciousness: awake Pain management: pain level controlled Respiratory status: spontaneous breathing Cardiovascular status: stable Postop Assessment: no apparent nausea or vomiting Anesthetic complications: no   No notable events documented.  Last Vitals:  Vitals:   05/28/21 1100 05/28/21 1151  BP: (!) 148/76 (!) 147/82  Pulse: 79 80  Resp: 15 16  Temp:  36.6 C  SpO2: 98% 98%    Last Pain:  Vitals:   05/28/21 1427  TempSrc:   PainSc: 8                  Juleah Paradise

## 2021-05-28 NOTE — Progress Notes (Signed)
Inpatient Rehab Admissions Coordinator:   Consult received.  Awaiting therapy evaluations/recommendations.   Estill Dooms, PT, DPT Admissions Coordinator [phone] 05/28/21  12:08 PM

## 2021-05-28 NOTE — Op Note (Signed)
° °  Date of Surgery: 05/28/2021  INDICATIONS: [name] is a 51 y.o.-year-old male who has osteomyelitis and abscess left heel.  PREOPERATIVE DIAGNOSIS: abscess and osteomyelitis left heel  POSTOPERATIVE DIAGNOSIS: Same.  PROCEDURE: Transtibial amputation Application of Prevena wound VAC  SURGEON: Sharol Given, M.D.  ANESTHESIA:  general  IV FLUIDS AND URINE: See anesthesia records.  ESTIMATED BLOOD LOSS: See anesthesia records.  COMPLICATIONS: None.  DESCRIPTION OF PROCEDURE: The patient was brought to the operating room after undergoing regional anesthetic. After adequate levels of anesthesia were obtained patient's lower extremity was prepped using DuraPrep draped into a sterile field. A timeout was called. The foot was draped out of the sterile field with impervious stockinette. A transverse incision was made 12 cm distal to the tibial tubercle. This curved proximally and a large posterior flap was created. The tibia was transected 1 cm proximal to the skin incision. The fibula was transected just proximal to the tibial incision. The tibia was beveled anteriorly. A large posterior flap was created. The sciatic nerve was pulled cut and allowed to retract. The vascular bundles were suture ligated with 2-0 silk. The deep and superficial fascial layers were closed using #1 Vicryl. The skin was closed using staples and 2-0 nylon. The wound was covered with a Prevena customizable and arthroform wound VAC.  The dressing was sealed with dermatac there was a good suction fit. A prosthetic shrinker and limb protector were applied. Patient was taken to the PACU in stable condition.   DISCHARGE PLANNING:  Antibiotic duration: 24 hours  Weightbearing: Nonweightbearing on the operative extremity  Pain medication: Opioid pathway  Dressing care/ Wound VAC: Continue wound VAC for 1 week after discharge  Discharge to: Discharge planning based on therapy's recommendations for possible inpatient  rehabilitation, outpatient rehabilitation, or discharge to home with therapy  Follow-up: In the office 1 week post operative.  Meridee Score, MD [place] 10:18 AM

## 2021-05-28 NOTE — Interval H&P Note (Signed)
History and Physical Interval Note:  05/28/2021 8:47 AM  [name]  has presented today for surgery, with the diagnosis of Osteomyelitis Left Heel.  The various methods of treatment have been discussed with the patient and family. After consideration of risks, benefits and other options for treatment, the patient has consented to  Procedure(s): LEFT BELOW KNEE AMPUTATION (Left) as a surgical intervention.  The patient's history has been reviewed, patient examined, no change in status, stable for surgery.  I have reviewed the patient's chart and labs.  Questions were answered to the patient's satisfaction.     Nadara Mustard

## 2021-05-28 NOTE — Anesthesia Procedure Notes (Signed)
Procedure Name: LMA Insertion Date/Time: 05/28/2021 9:19 AM Performed by: Shary Decamp, CRNA Pre-anesthesia Checklist: Patient identified, Patient being monitored, Timeout performed, Emergency Drugs available and Suction available Patient Re-evaluated:Patient Re-evaluated prior to induction Oxygen Delivery Method: Circle system utilized Preoxygenation: Pre-oxygenation with 100% oxygen Induction Type: IV induction Ventilation: Mask ventilation without difficulty LMA: LMA inserted LMA Size: 5.0 Number of attempts: 1 Placement Confirmation: positive ETCO2 and breath sounds checked- equal and bilateral Tube secured with: Tape Dental Injury: Teeth and Oropharynx as per pre-operative assessment

## 2021-05-28 NOTE — Progress Notes (Signed)
[facility] PROGRESS NOTE   [name] [id number] DOB: 1970/11/23 DOA: 05/26/2021  2 DOS: the patient was seen and examined on 05/28/2021  PCP: Martinique, Sarah T, MD  Brief History and Hospital Course:  51 y.o. male with medical history significant of TDM, HTN, who presented to [facility] as a transfer from [facility]. He was evaluated in ED on 05/26/21. Sugar was 580 on arrival, but no DKA. Received IVF. Also had fever to 103. Abscess on left calcaneal area was incised and drained with approximately 300cc of pus drainage. Culture and gram stain obtained. Blood culture showed gram positive cocci-staph aureus detected by pcr. He states he had surgery on his left heel in 2021. He states after surgery he had no issues. He states about 1-2 days prior to admission he pushed on his left heel and it felt "mushy." And was mildly red.  He presented initially to [facility].  Transferred to [facility].  MRI foot showed evidence for septic joint and osteomyelitis.  Seen by podiatry and orthopedics.  Plan is for transtibial amputation today.  Consultants: Podiatry.  Infectious disease.  Orthopedics  Procedures: None yet    Subjective: Denies any pain in the left foot.  Slightly anxious about his surgery. Denies any nausea vomiting.      Assessment/Plan:  * MSSA bacteremia Per records from [place] had blood culture with  +staph aureus in setting of left foot abscess and possible acute on chronic osteo. Initially placed on vancomycin cefepime and metronidazole. Infectious diseases following.  Echocardiogram did not show any acute findings.  ID recommending TEE.  Will involve cardiology to arrange this.  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  He underwent MRI of his foot which  showed raised concern for acute osteomyelitis of the calcaneum.  Findings suggestive of cellulitis also noted.  Small tibiotalar and posterior subtalar joint effusions noted raising concern for septic arthritis.  Diffuse edema noted throughout the intrinsic foot musculature compatible with nonspecific myositis. Podiatry was consulted.  Podiatry recommended evaluation by orthopedics.  Discussed with Dr. Sharol Given.  Plan is for transtibial amputation today. Patient was initially placed on vancomycin cefepime and metronidazole.  Infectious disease was consulted.  Vancomycin was discontinued.  Further plans per ID.  Insulin-requiring or dependent type II diabetes mellitus (HCC) HbA1c is 9.0.  Patient remains on Levemir and SSI.  CBGs remain poorly controlled.  Will need to adjust the dose of Levemir after his surgery.  Pernicious anemia- (present on admission) Continue B12 supplements.  Hemoglobin is stable.    Right shoulder pain Apparently twisted his arm a week or so ago with now limited range of motion of the right shoulder with pain.  Discussed with patient.  Will need for his acute issues to subside before any evaluation for the shoulder can be performed.  He did undergo plain films apparently in [place] which were unremarkable.  Pain control for now.  PT evaluation.  Further work-up in the outpatient setting.    Essential hypertension- (present on admission) Stable.  Continue losartan.       DVT Prophylaxis: Initiate Lovenox from tomorrow if no signs of bleeding after surgery Code Status: Full code Family Communication: Discussed with patient Disposition Plan: To be determined  Status is: Inpatient Remains inpatient appropriate because: Left foot osteomyelitis with cellulitis requiring  IV antibiotics     Medications: Scheduled:  chlorhexidine  60 mL Topical Once   [MAR Hold] diclofenac Sodium  4 g Topical QID   [MAR Hold] ferrous sulfate  324 mg Oral Q breakfast   [MAR Hold] insulin  aspart  0-20 Units Subcutaneous TID WC   [MAR Hold] insulin aspart  0-5 Units Subcutaneous QHS   [MAR Hold] insulin detemir  14 Units Subcutaneous QHS   [MAR Hold] losartan  25 mg Oral Daily   [MAR Hold] melatonin  5 mg Oral QHS   [MAR Hold] mupirocin ointment  1 application Nasal BID   povidone-iodine  2 application Topical Once   [MAR Hold] sodium chloride flush  3 mL Intravenous Q12H   [MAR Hold] vitamin B-12  1,000 mcg Oral Daily   Continuous:  [MAR Hold] sodium chloride     [MAR Hold] ceFEPime (MAXIPIME) IV 2 g (05/28/21 0527)   lactated ringers 10 mL/hr at 05/28/21 0845   [MAR Hold] metronidazole 500 mg (05/28/21 0815)   PRN:[MAR Hold] sodium chloride, [MAR Hold] acetaminophen **OR** [MAR Hold] acetaminophen, insulin aspart, [MAR Hold] ondansetron **OR** [MAR Hold] ondansetron (ZOFRAN) IV, [MAR Hold] oxyCODONE, [MAR Hold] sodium chloride flush  Antibiotics: Anti-infectives (From admission, onward)    Start     Dose/Rate Route Frequency Ordered Stop   05/27/21 0900  vancomycin (VANCOREADY) IVPB 1500 mg/300 mL  Status:  Discontinued        1,500 mg 150 mL/hr over 120 Minutes Intravenous Every 12 hours 05/26/21 2001 05/27/21 0857   05/26/21 2200  [MAR Hold]  ceFEPIme (MAXIPIME) 2 g in sodium chloride 0.9 % 100 mL IVPB        (MAR Hold since Tue 05/28/2021 at 0830.Hold Reason: Transfer to a Procedural area)   2 g 200 mL/hr over 30 Minutes Intravenous Every 8 hours 05/26/21 1850     05/26/21 2000  [MAR Hold]  metroNIDAZOLE (FLAGYL) IVPB 500 mg        (MAR Hold since Tue 05/28/2021 at 0830.Hold Reason: Transfer to a Procedural area)   500 mg 100 mL/hr over 60 Minutes Intravenous Every 12 hours 05/26/21 1820     05/26/21 1945  vancomycin (VANCOREADY) IVPB 1750 mg/350 mL        1,750 mg 175 mL/hr over 120 Minutes Intravenous  Once 05/26/21 1850 05/27/21 0109       Objective:  Vital Signs  Vitals:   05/27/21 2027 05/28/21 0415 05/28/21 0801 05/28/21 0842  BP: 133/81 126/78  137/81 (!) 165/90  Pulse: 73 84 80 90  Resp: 17 16 20 18   Temp: 98 F (36.7 C) 98.9 F (37.2 C) 98.6 F (37 C) 98.3 F (36.8 C)  TempSrc: Oral Oral Oral Oral  SpO2: 98% 96% 97% 97%  Weight:      Height:        Intake/Output Summary (Last 24 hours) at 05/28/2021 0949 Last data filed at 05/28/2021 0920 Gross per 24 hour  Intake 895 ml  Output 1025 ml  Net -130 ml    Filed Weights   05/26/21 1722  Weight: 76 kg    General appearance: Awake alert.  In no distress Resp: Clear to auscultation bilaterally.  Normal effort Cardio: S1-S2 is normal regular.  No S3-S4.  No rubs murmurs or bruit GI: Abdomen is soft.  Nontender nondistended.  Bowel sounds are present normal.  No masses organomegaly Extremities: Left foot covered in dressing Neurologic: Alert and oriented x3.  No focal neurological deficits.  Lab Results:  Data Reviewed: I have personally reviewed labs and imaging study reports  CBC: Recent Labs  Lab 05/27/21 0232 05/28/21 0316  WBC 10.7* 8.5  HGB 11.5* 12.2*  HCT 34.4* 35.8*  MCV 88.4 88.8  PLT 400 454*     Basic Metabolic Panel: Recent Labs  Lab 05/26/21 1849 05/27/21 0232 05/28/21 0316  NA 130* 133* 136  K 4.3 4.2 3.8  CL 93* 96* 100  CO2 22 22 27   GLUCOSE 394* 324* 207*  BUN 13 14 15   CREATININE 0.85 0.89 0.60*  CALCIUM 8.5* 8.5* 8.7*     GFR: Estimated Creatinine Clearance: 116.1 mL/min (A) (by C-G formula based on SCr of 0.6 mg/dL (L)).  Liver Function Tests: Recent Labs  Lab 05/26/21 1849  AST 20  ALT 17  ALKPHOS 110  BILITOT 0.6  PROT 6.9  ALBUMIN 2.4*      Coagulation Profile: Recent Labs  Lab 05/26/21 1849  INR 1.2      HbA1C: Recent Labs    05/26/21 1846  HGBA1C 9.0*     CBG: Recent Labs  Lab 05/27/21 0908 05/27/21 1124 05/27/21 1526 05/27/21 2231 05/28/21 0801  GLUCAP 279* 265* 118* 244* 240*     Radiology Studies: MR FOOT LEFT WO CONTRAST  Result Date: 05/27/2021 CLINICAL DATA:   Evaluate for osteomyelitis of the foot. History of diabetes. Transfer from [facility] Left calcaneal abscess with approximally 300 mL of pus drainage. Fever of 103. Gram-positive cocci staph aureus detected by PCR. Status post surgery on left heel 05/2019. EXAM: MRI OF THE LEFT FOOT WITHOUT CONTRAST TECHNIQUE: Multiplanar, multisequence MR imaging of the left hindfoot was performed. No intravenous contrast was administered. COMPARISON:  left foot radiographs 05/26/2021 11/01/2020; MRI left forefoot 08/28/2020 MRI left hindfoot 05/29/2020 FINDINGS: Bones/Joint/Cartilage Postsurgical changes are again seen on prior posteroinferior calcaneus resection. There is increased erosion compared to 05/29/2020 within the posteroinferior aspect of the calcaneus. There is moderate diffuse marrow edema throughout the calcaneus greatest within the posterior plantar aspect radius findings are suspicious for active osteomyelitis. Moderate dorsal talonavicular degenerative osteophytosis. Severe navicular-cuneiform diffuse cartilage thinning and joint space narrowing. New moderate subchondral marrow edema within the proximal aspect of the lateral cuneiform. There are again small tibiotalar and posterior subtalar joint effusions. This again raises suspicion for septic arthritis. Ligaments The medial and lateral ankle ligaments are intact. The Lisfranc ligament complex is intact. Muscles and Tendons There is moderate edema within the plantar foot musculature nonspecific myositis. There appears to be new mild connection of the distal Achilles tendon in the region of the prior full-thickness tear or surgical release. This may be secondary to scarring. Soft tissues There is diffuse edema and moderate soft tissue swelling throughout the posterior plantar hindfoot soft tissues consistent with the prior ulceration and postsurgical changes. There is scattered low signal with blooming artifact within the posterior plantar medial soft  tissues. Some of the blooming artifact is compatible with metallic postsurgical change however this raises suspicion for air from a gas producing organism, especially as no definite metallic density is seen in this region on recent 05/26/2021 radiographs. There is decreased T1 and decreased T2 soft tissue signal in a somewhat bandlike configuration in the region of the prior complete soft tissue ulceration previously completely contact of the posteroinferior calcaneus on 05/29/2020 prior MRI. This is compatible with scarring in the region of the prior sinus tract/surgical wound dehiscence, however the edema and air suggests infectious cellulitis that contacts the posteroinferior plantar aspect  of the remaining calcaneus. There are also small areas of fluid signal seen plantar to the remaining hindfoot flexor digitorum brevis musculature and deep to the abductor hallucis musculature. Otherwise no large abscess is visualized. IMPRESSION:: IMPRESSION: 1. Status post partial resection of the posteroinferior aspect of the calcaneus for osteomyelitis. Increased erosion of the posteroinferior aspect of the calcaneus compared to 05/29/2020 prior MRI. There is also extensive marrow edema throughout the calcaneus. Findings are concerning for active/acute osteomyelitis. 2. Some scarring in the prior of the ulceration or surgical wound dehiscence at the posterior plantar aspect of the left heel. Surrounding likely infectious cellulitis with edema swelling and artifact suspicious for a gas producing organism. 3. Small tibiotalar and posterior subtalar joint effusions again concerning for septic arthritis. 4. Interval scarring of the prior full-thickness distal Achilles tendon tear with mild new tendon continuity. 5. Diffuse edema is again seen throughout the intrinsic foot musculature compatible with nonspecific myositis. Electronically Signed   By: Yvonne Kendall M.D.   On: 05/27/2021 08:33   VAS [place] ABI WITH/WO TBI  Result  Date: 05/27/2021  LOWER EXTREMITY DOPPLER STUDY Patient Name:  [name]  Date of Exam:   05/27/2021 Medical Rec #: [id number]     Accession #:    [id number] Date of Birth: [birth year]     Patient Gender: M Patient Age:   87 years Exam Location:  [facility] Procedure:      VAS [place] ABI WITH/WO TBI Referring Phys: Jule Ser --------------------------------------------------------------------------------  Indications: Ulceration. High Risk Factors: Hypertension, Diabetes, past history of smoking.  Comparison Study: Patient states previous exam performed at outside facility                   [facility] unable to see results. Performing Technologist: Rogelia Rohrer RVT, RDMS  Examination Guidelines: A complete evaluation includes at minimum, Doppler waveform signals and systolic blood pressure reading at the level of bilateral brachial, anterior tibial, and posterior tibial arteries, when vessel segments are accessible. Bilateral testing is considered an integral part of a complete examination. Photoelectric Plethysmograph (PPG) waveforms and toe systolic pressure readings are included as required and additional duplex testing as needed. Limited examinations for reoccurring indications may be performed as noted.  ABI Findings: +---------+------------------+-----+---------+--------+  Right     Rt Pressure (mmHg) Index Waveform  Comment   +---------+------------------+-----+---------+--------+  Brachial                           triphasic         +---------+------------------+-----+---------+--------+  PTA       160                1.13  triphasic           +---------+------------------+-----+---------+--------+  DP        143                1.01  biphasic            +---------+------------------+-----+---------+--------+  Great Toe 124                0.88  Normal              +---------+------------------+-----+---------+--------+ +---------+------------------+-----+---------+-------+  Left      Lt Pressure  (mmHg) Index Waveform  Comment  +---------+------------------+-----+---------+-------+  Brachial  141                      triphasic          +---------+------------------+-----+---------+-------+  PTA       147                1.04  triphasic          +---------+------------------+-----+---------+-------+  DP        154                1.09  triphasic          +---------+------------------+-----+---------+-------+  Great Toe 130                0.92  Normal             +---------+------------------+-----+---------+-------+ +-------+-----------+-----------+------------+------------+  ABI/TBI Today's ABI Today's TBI Previous ABI Previous TBI  +-------+-----------+-----------+------------+------------+  Right   1.13        0.88                                   +-------+-----------+-----------+------------+------------+  Left    1.09        0.92                                   +-------+-----------+-----------+------------+------------+    PT did not want Pressure taken on RUE due to recent injury.  Summary: Right: Resting right ankle-brachial index is within normal range. No evidence of significant right lower extremity arterial disease. The right toe-brachial index is normal. Left: Resting left ankle-brachial index is within normal range. No evidence of significant left lower extremity arterial disease. The left toe-brachial index is normal.  *See table(s) above for measurements and observations.  Electronically signed by Monica Martinez MD on 05/27/2021 at 6:55:42 PM.    Final    ECHOCARDIOGRAM COMPLETE  Result Date: 05/27/2021    ECHOCARDIOGRAM REPORT   Patient Name:   [name] Date of Exam: 05/27/2021 Medical Rec #:  [id number]    Height:       75.0 in Accession #:    [id number]   Weight:       167.5 lb Date of Birth:  1970-07-20    BSA:          2.035 m Patient Age:    49 years     BP:           142/78 mmHg Patient Gender: M            HR:           79 bpm. Exam Location:  Inpatient Procedure: 2D Echo,  Cardiac Doppler and Color Doppler Indications:    Bacteremia R78.81  History:        Patient has no prior history of Echocardiogram examinations.                 Risk Factors:Diabetes.  Sonographer:    Bernadene Person RDCS Referring Phys: [id number] Diaz  1. Left ventricular ejection fraction, by estimation, is 55 to 60%. The left ventricle has normal function. The left ventricle has no regional wall motion abnormalities. Left ventricular diastolic function could not be evaluated.  2. Right ventricular systolic function is normal. The right ventricular size is normal.  3. The mitral valve is normal in structure. No evidence of mitral valve regurgitation. No evidence of mitral stenosis.  4. The aortic valve is normal in structure. Aortic valve regurgitation is not visualized. No aortic stenosis is present.  5. Evidence  of atrial level shunting detected by color flow Doppler. FINDINGS  Left Ventricle: Left ventricular ejection fraction, by estimation, is 55 to 60%. The left ventricle has normal function. The left ventricle has no regional wall motion abnormalities. The left ventricular internal cavity size was normal in size. There is  no left ventricular hypertrophy. Left ventricular diastolic function could not be evaluated. Right Ventricle: The right ventricular size is normal. Right vetricular wall thickness was not well visualized. Right ventricular systolic function is normal. Left Atrium: Left atrial size was normal in size. Right Atrium: Right atrial size was normal in size. Pericardium: There is no evidence of pericardial effusion. Mitral Valve: The mitral valve is normal in structure. No evidence of mitral valve regurgitation. No evidence of mitral valve stenosis. There is no evidence of mitral valve vegetation. Tricuspid Valve: The tricuspid valve is normal in structure. Tricuspid valve regurgitation is trivial. Aortic Valve: The aortic valve is normal in structure. Aortic valve  regurgitation is not visualized. No aortic stenosis is present. Pulmonic Valve: The pulmonic valve was normal in structure. Pulmonic valve regurgitation is not visualized. Aorta: The aortic root and ascending aorta are structurally normal, with no evidence of dilitation. IAS/Shunts: Evidence of atrial level shunting detected by color flow Doppler.  LEFT VENTRICLE PLAX 2D LVIDd:         5.00 cm LVIDs:         3.70 cm LV PW:         1.00 cm LV IVS:        0.90 cm LVOT diam:     2.10 cm LV SV:         57 LV SV Index:   28 LVOT Area:     3.46 cm  LV Volumes (MOD) LV vol d, MOD A2C: 130.0 ml LV vol d, MOD A4C: 97.7 ml LV vol s, MOD A2C: 57.8 ml LV vol s, MOD A4C: 44.7 ml LV SV MOD A2C:     72.2 ml LV SV MOD A4C:     97.7 ml LV SV MOD BP:      66.3 ml RIGHT VENTRICLE RV S prime:     11.00 cm/s TAPSE (M-mode): 2.0 cm LEFT ATRIUM             Index        RIGHT ATRIUM           Index LA diam:        3.10 cm 1.52 cm/m   RA Area:     14.20 cm LA Vol (A2C):   32.1 ml 15.77 ml/m  RA Volume:   36.60 ml  17.98 ml/m LA Vol (A4C):   33.2 ml 16.31 ml/m LA Biplane Vol: 33.3 ml 16.36 ml/m  AORTIC VALVE LVOT Vmax:   90.80 cm/s LVOT Vmean:  56.100 cm/s LVOT VTI:    0.166 m  AORTA Ao Root diam: 3.00 cm MITRAL VALVE MV Area (PHT): 4.17 cm    SHUNTS MV Decel Time: 182 msec    Systemic VTI:  0.17 m MV E velocity: 67.70 cm/s  Systemic Diam: 2.10 cm MV A velocity: 61.30 cm/s MV E/A ratio:  1.10 Mertie Moores MD Electronically signed by Mertie Moores MD Signature Date/Time: 05/27/2021/3:14:59 PM    Final        LOS: 2 days   [facility] Pager on [email or web address]  05/28/2021, 9:49 AM

## 2021-05-28 NOTE — Progress Notes (Signed)
Pharmacy Antibiotic Note  [name] is a 51 y.o. male admitted on 05/26/2021 with MSSA bacteremia/L-foot osteo now s/p trans-tib amp by ortho. Pharmacy has been consulted to narrow antibiotics to Cefazolin.   Plan: - D/c Cefepime + Flagyl - Start Cefazolin 2g IV every 8 hours - TEE scheduled for 2/22 - LOT yet to be determined pending additional testing  Height: 6\' 3"  (190.5 cm) Weight: 76 kg (167 lb 8.8 oz) IBW/kg (Calculated) : 84.5  Temp (24hrs), Avg:98.2 F (36.8 C), Min:97.5 F (36.4 C), Max:98.9 F (37.2 C)  Recent Labs  Lab 05/26/21 1849 05/27/21 0232 05/28/21 0316  WBC  --  10.7* 8.5  CREATININE 0.85 0.89 0.60*    Estimated Creatinine Clearance: 116.1 mL/min (A) (by C-G formula based on SCr of 0.6 mg/dL (L)).    Allergies  Allergen Reactions   Piperacillin Hives   Tazobactam Hives    Thank you for allowing pharmacy to be a part of this patients care.  05/30/21, PharmD, BCPS Clinical Pharmacist 05/28/2021 2:14 PM   **Pharmacist phone directory can now be found on [email or web address] ([email or web address]).  Listed under [facility].

## 2021-05-28 NOTE — Plan of Care (Signed)
Patient went to OR today for a Left BKA. NWPT in place. Drsg c/d/I. VSS. C/o pain, given PRN meds. Tolerating diet. Voiding. SCD on RLE. Call bell within reach. Bed alarm on.  Problem: Clinical Measurements: Goal: Ability to maintain clinical measurements within normal limits will improve Outcome: Progressing Goal: Will remain free from infection Outcome: Progressing Goal: Diagnostic test results will improve Outcome: Progressing Goal: Respiratory complications will improve Outcome: Progressing Goal: Cardiovascular complication will be avoided Outcome: Progressing   Problem: Elimination: Goal: Will not experience complications related to bowel motility Outcome: Progressing Goal: Will not experience complications related to urinary retention Outcome: Progressing   Problem: Pain Managment: Goal: General experience of comfort will improve Outcome: Progressing   Problem: Safety: Goal: Ability to remain free from injury will improve Outcome: Progressing   Problem: Skin Integrity: Goal: Risk for impaired skin integrity will decrease Outcome: Progressing

## 2021-05-28 NOTE — Progress Notes (Signed)
Orthopedic Tech Progress Note Patient Details:  [name] [birth year] [id number]  Called in order to [facility] for an AMPUSHIELD BK with SHRINKER  Patient ID: [name], male   DOB: 1970-12-26, 51 y.o.   MRN: [id number]  [name] 05/28/2021, 12:27 PM

## 2021-05-28 NOTE — Transfer of Care (Signed)
Immediate Anesthesia Transfer of Care Note  Patient: [name]  Procedure(s) Performed: LEFT BELOW KNEE AMPUTATION (Left: Knee)  Patient Location: PACU  Anesthesia Type:General  Level of Consciousness: awake, patient cooperative and responds to stimulation  Airway & Oxygen Therapy: Patient Spontanous Breathing and Patient connected to nasal cannula oxygen  Post-op Assessment: Report given to RN and Post -op Vital signs reviewed and stable  Post vital signs: Reviewed and stable  Last Vitals:  Vitals Value Taken Time  BP 151/88 05/28/21 1015  Temp 36.4 C 05/28/21 1015  Pulse 75 05/28/21 1019  Resp 15 05/28/21 1019  SpO2 100 % 05/28/21 1019  Vitals shown include unvalidated device data.  Last Pain:  Vitals:   05/28/21 1015  TempSrc:   PainSc: 10-Worst pain ever         Complications: No notable events documented.

## 2021-05-28 NOTE — Progress Notes (Signed)
Patient ID: [name], male   DOB: 05-Sep-1970, 51 y.o.   MRN: [id number] Patient is seen in follow-up status is post left transtibial amputation this morning.  Patient feels much better he is eating.  The wound VAC is functioning well.  Patient was given a mirror to use during the day to help minimize phantom pain symptoms.  The mirror will stay on the floor when patient is discharged.  Patient will leave with the portable Praveena wound VAC pump which is in the white bag.  Decision of inpatient rehab versus outpatient rehab to be coordinated with therapy.  [place] until [place].  I will follow-up in the office in 1 week.

## 2021-05-29 ENCOUNTER — Inpatient Hospital Stay: Payer: Self-pay

## 2021-05-29 ENCOUNTER — Inpatient Hospital Stay (HOSPITAL_COMMUNITY): Payer: Medicaid Other

## 2021-05-29 ENCOUNTER — Encounter (HOSPITAL_COMMUNITY): Payer: Self-pay | Admitting: Orthopedic Surgery

## 2021-05-29 ENCOUNTER — Encounter (HOSPITAL_COMMUNITY)
Admission: AD | Disposition: A | Discharge status: Home Health | Payer: Self-pay | Source: Other Acute Inpatient Hospital | Attending: Internal Medicine

## 2021-05-29 DIAGNOSIS — E119 Type 2 diabetes mellitus without complications: Secondary | ICD-10-CM

## 2021-05-29 LAB — CBC
HCT: 33.3 % — ABNORMAL LOW (ref 39.0–52.0)
Hemoglobin: 11.1 g/dL — ABNORMAL LOW (ref 13.0–17.0)
MCH: 29.5 pg (ref 26.0–34.0)
MCHC: 33.3 g/dL (ref 30.0–36.0)
MCV: 88.6 fL (ref 80.0–100.0)
Platelets: 490 10*3/uL — ABNORMAL HIGH (ref 150–400)
RBC: 3.76 MIL/uL — ABNORMAL LOW (ref 4.22–5.81)
RDW: 11.5 % (ref 11.5–15.5)
WBC: 9.3 10*3/uL (ref 4.0–10.5)
nRBC: 0 % (ref 0.0–0.2)

## 2021-05-29 LAB — GLUCOSE, CAPILLARY
Glucose-Capillary: 155 mg/dL — ABNORMAL HIGH (ref 70–99)
Glucose-Capillary: 257 mg/dL — ABNORMAL HIGH (ref 70–99)
Glucose-Capillary: 192 mg/dL — ABNORMAL HIGH (ref 70–99)
Glucose-Capillary: 258 mg/dL — ABNORMAL HIGH (ref 70–99)

## 2021-05-29 LAB — BASIC METABOLIC PANEL
Anion gap: 10 (ref 5–15)
BUN: 8 mg/dL (ref 6–20)
CO2: 27 mmol/L (ref 22–32)
Calcium: 8.5 mg/dL — ABNORMAL LOW (ref 8.9–10.3)
Chloride: 97 mmol/L — ABNORMAL LOW (ref 98–111)
Creatinine, Ser: 0.61 mg/dL (ref 0.61–1.24)
GFR, Estimated: >60 mL/min (ref >60)
Glucose, Bld: 164 mg/dL — ABNORMAL HIGH (ref 70–99)
Potassium: 3.8 mmol/L (ref 3.5–5.1)
Sodium: 134 mmol/L — ABNORMAL LOW (ref 135–145)

## 2021-05-29 LAB — SURGICAL PATHOLOGY

## 2021-05-29 SURGERY — ECHOCARDIOGRAM, TRANSESOPHAGEAL
Anesthesia: Monitor Anesthesia Care

## 2021-05-29 MED ORDER — INSULIN DETEMIR 100 UNIT/ML ~~LOC~~ SOLN
28.0000 [IU] | Freq: Every day | SUBCUTANEOUS | Status: DC
  Administered 2021-05-29: 28 [IU] via SUBCUTANEOUS
  Filled 2021-05-29 (×2): qty 0.28

## 2021-05-29 MED ORDER — CHLORHEXIDINE GLUCONATE CLOTH 2 % EX PADS
6.0000 | MEDICATED_PAD | Freq: Every day | CUTANEOUS | Status: DC
  Administered 2021-05-29 – 2021-05-30 (×2): 6 via TOPICAL

## 2021-05-29 MED ORDER — SODIUM CHLORIDE 0.9% FLUSH: 10.0000 mL | INTRAVENOUS | Status: DC | PRN

## 2021-05-29 MED ORDER — SODIUM CHLORIDE 0.9% FLUSH
10.0000 mL | Freq: Two times a day (BID) | INTRAVENOUS | Status: DC
  Administered 2021-05-29 – 2021-05-30 (×3): 10 mL

## 2021-05-29 NOTE — Progress Notes (Signed)
OT Cancellation Note  Patient Details Name: Gurtaj Almeida MRN: [id number] DOB: 1970/12/23   Cancelled Treatment:    Reason Eval/Treat Not Completed: Patient at procedure or test/ unavailable (Pt getting PICC line first attempt, not awaiting xray.)  Malka So 05/29/2021, 1:28 PM Nestor Lewandowsky, OTR/L Acute Rehabilitation Services Pager: [phone] Office: [phone]

## 2021-05-29 NOTE — Progress Notes (Signed)
PHARMACY CONSULT NOTE FOR:  OUTPATIENT  PARENTERAL ANTIBIOTIC THERAPY (OPAT)  Indication: MSSA bacteremia Regimen: Cefazolin 2g IV every 8 hours End date: 07/07/21  IV antibiotic discharge orders are pended. To discharging provider:  please sign these orders via discharge navigator,  Select New Orders & click on the button choice - Manage This Unsigned Work.     Thank you for allowing pharmacy to be a part of this patient's care.  [name] 05/29/2021, 10:12 AM

## 2021-05-29 NOTE — Progress Notes (Signed)
[facility]  Date of Admission:  05/26/2021           Reason for visit: Follow up on MSSA bacteremia  Current antibiotics: Cefazolin 2/21-present  Previous antibiotics: Cefepime 2/19 - 2/20 Vancomycin 2/19 Metronidazole 2/19-2/21  ASSESSMENT:    51 y.o. male admitted with:  Left calcaneus osteomyelitis and abscess: Complicated by MSSA bacteremia.  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 MSSA bacteremia: Noted on outside hospital blood cultures.  Repeat cultures drawn here on 05/26/2021 are no growth to date.  TTE was negative for endocarditis and patient has declined TEE to further evaluate. Sepsis: Secondary to #1 and #2.  Improved. Diabetes  RECOMMENDATIONS:    Continue cefazolin.  We will plan for 6 weeks of IV therapy given the inability to exclude endocarditis based on patient's decline of TEE Place PICC line Glycemic control See final OPAT note below.  We will sign off, please call as needed  Diagnosis: Osteo and bacteremia  Culture Result: MSSA  Allergies  Allergen Reactions   Piperacillin Hives    Tolerated Cefepime and Cefazolin Feb 2023   Tazobactam Hives    OPAT Orders Discharge antibiotics to be given via PICC line Discharge antibiotics: Per pharmacy protocol cefazolin  Duration: 6 weeks  End Date: July 09, 2021  [place] Per Protocol:  Home health RN for IV administration and teaching; PICC line care and labs.    Labs weekly while on IV antibiotics: _xx_ CBC with differential _xx_ BMP __ CMP __ CRP __ ESR __ Vancomycin trough __ CK  _xx_ Please pull PIC at completion of IV antibiotics __ Please leave PIC in place until doctor has seen patient or been notified  Fax weekly labs to [phone]  Clinic Follow Up Appt: 06/12/21 @ 330 pm with Dr Juleen China    Principal Problem:   MSSA bacteremia Active Problems:   Insulin-requiring or dependent type II diabetes mellitus ([place])   Acute on chronic  osteomyelitis of left foot with abscess    Pernicious anemia   Right shoulder pain   Essential hypertension   Sepsis due to methicillin susceptible Staphylococcus aureus ([place])   Acute osteomyelitis of left calcaneus (HCC)    MEDICATIONS:    Scheduled Meds:  vitamin C  1,000 mg Oral Daily   diclofenac Sodium  4 g Topical QID   docusate sodium  100 mg Oral Daily   ferrous sulfate  324 mg Oral Q breakfast   insulin aspart  0-20 Units Subcutaneous TID WC   insulin aspart  0-5 Units Subcutaneous QHS   insulin detemir  28 Units Subcutaneous QHS   losartan  25 mg Oral Daily   melatonin  5 mg Oral QHS   mupirocin ointment  1 application Nasal BID   nutrition supplement (JUVEN)  1 packet Oral BID BM   pantoprazole  40 mg Oral Daily   sodium chloride flush  3 mL Intravenous Q12H   vitamin B-12  1,000 mcg Oral Daily   zinc sulfate  220 mg Oral Daily   Continuous Infusions:  sodium chloride     sodium chloride 75 mL/hr at 05/28/21 1349    ceFAZolin (ANCEF) IV 2 g (05/29/21 0525)   magnesium sulfate bolus IVPB     PRN Meds:.sodium chloride, acetaminophen **OR** acetaminophen, acetaminophen, alum & mag hydroxide-simeth, bisacodyl, diphenhydrAMINE, guaiFENesin-dextromethorphan, hydrALAZINE, HYDROmorphone (DILAUDID) injection, labetalol, magnesium citrate, magnesium sulfate bolus IVPB, metoprolol tartrate, ondansetron, ondansetron **OR** [DISCONTINUED] ondansetron (ZOFRAN) IV, oxyCODONE, oxyCODONE, phenol, polyethylene  glycol, potassium chloride, sodium chloride flush  SUBJECTIVE:   24 hour events:  S/p BKA No other events    States he feels much better.  No new complaints.  Pain adequately controlled.  ROS Other ROS negative except as noted above.    OBJECTIVE:   Blood pressure 125/80, pulse 88, temperature 97.8 F (36.6 C), resp. rate 19, height _0  (1.905 m), weight 76 kg, SpO2 96 %. Body mass index is 20.94 kg/m.  Physical Exam Gen: sitting in bed, NAD Pulm: Normal  effort Head: NCAT Extrem: s/p left BKA Neuro: A&O x3  Lab Results: Lab Results  Component Value Date   WBC 9.3 05/29/2021   HGB 11.1 (L) 05/29/2021   HCT 33.3 (L) 05/29/2021   MCV 88.6 05/29/2021   PLT 490 (H) 05/29/2021    Lab Results  Component Value Date   NA 134 (L) 05/29/2021   K 3.8 05/29/2021   CO2 27 05/29/2021   GLUCOSE 164 (H) 05/29/2021   BUN 8 05/29/2021   CREATININE 0.61 05/29/2021   CALCIUM 8.5 (L) 05/29/2021   GFRNONAA >60 05/29/2021   GFRAA >60 11/06/2013    Lab Results  Component Value Date   ALT 17 05/26/2021   AST 20 05/26/2021   ALKPHOS 110 05/26/2021   BILITOT 0.6 05/26/2021    No results found for: CRP     Component Value Date/Time   ESRSEDRATE 3 11/05/2013 1141     I have reviewed the micro and lab results in Epic.  Imaging: VAS [place] ABI WITH/WO TBI  Result Date: 05/27/2021  LOWER EXTREMITY DOPPLER STUDY Patient Name:  NICHOALS HEYDE  Date of Exam:   05/27/2021 Medical Rec #: [id number]     Accession #:    [id number] Date of Birth: [birth year]     Patient Gender: M Patient Age:   66 years Exam Location:  [facility] Procedure:      VAS [place] ABI WITH/WO TBI Referring Phys: Jule Ser --------------------------------------------------------------------------------  Indications: Ulceration. High Risk Factors: Hypertension, Diabetes, past history of smoking.  Comparison Study: Patient states previous exam performed at outside facility                   [facility] unable to see results. Performing Technologist: Rogelia Rohrer RVT, RDMS  Examination Guidelines: A complete evaluation includes at minimum, Doppler waveform signals and systolic blood pressure reading at the level of bilateral brachial, anterior tibial, and posterior tibial arteries, when vessel segments are accessible. Bilateral testing is considered an integral part of a complete examination. Photoelectric Plethysmograph (PPG) waveforms and toe systolic pressure readings are included as  required and additional duplex testing as needed. Limited examinations for reoccurring indications may be performed as noted.  ABI Findings: +---------+------------------+-----+---------+--------+  Right     Rt Pressure (mmHg) Index Waveform  Comment   +---------+------------------+-----+---------+--------+  Brachial                           triphasic         +---------+------------------+-----+---------+--------+  PTA       160                1.13  triphasic           +---------+------------------+-----+---------+--------+  DP        143                1.01  biphasic            +---------+------------------+-----+---------+--------+  Great Toe 124                0.88  Normal              +---------+------------------+-----+---------+--------+ +---------+------------------+-----+---------+-------+  Left      Lt Pressure (mmHg) Index Waveform  Comment  +---------+------------------+-----+---------+-------+  Brachial  141                      triphasic          +---------+------------------+-----+---------+-------+  PTA       147                1.04  triphasic          +---------+------------------+-----+---------+-------+  DP        154                1.09  triphasic          +---------+------------------+-----+---------+-------+  Great Toe 130                0.92  Normal             +---------+------------------+-----+---------+-------+ +-------+-----------+-----------+------------+------------+  ABI/TBI Today's ABI Today's TBI Previous ABI Previous TBI  +-------+-----------+-----------+------------+------------+  Right   1.13        0.88                                   +-------+-----------+-----------+------------+------------+  Left    1.09        0.92                                   +-------+-----------+-----------+------------+------------+    PT did not want Pressure taken on RUE due to recent injury.  Summary: Right: Resting right ankle-brachial index is within normal range. No evidence of significant  right lower extremity arterial disease. The right toe-brachial index is normal. Left: Resting left ankle-brachial index is within normal range. No evidence of significant left lower extremity arterial disease. The left toe-brachial index is normal.  *See table(s) above for measurements and observations.  Electronically signed by Monica Martinez MD on 05/27/2021 at 6:55:42 PM.    Final    ECHOCARDIOGRAM COMPLETE  Result Date: 05/27/2021    ECHOCARDIOGRAM REPORT   Patient Name:   [name] Date of Exam: 05/27/2021 Medical Rec #:  [id number]    Height:       75.0 in Accession #:    [id number]   Weight:       167.5 lb Date of Birth:  18-Nov-1970    BSA:          2.035 m Patient Age:    89 years     BP:           142/78 mmHg Patient Gender: M            HR:           79 bpm. Exam Location:  Inpatient Procedure: 2D Echo, Cardiac Doppler and Color Doppler Indications:    Bacteremia R78.81  History:        Patient has no prior history of Echocardiogram examinations.                 Risk Factors:Diabetes.  Sonographer:    Bernadene Person RDCS Referring Phys: [id number] [place]  1. Left  ventricular ejection fraction, by estimation, is 55 to 60%. The left ventricle has normal function. The left ventricle has no regional wall motion abnormalities. Left ventricular diastolic function could not be evaluated.  2. Right ventricular systolic function is normal. The right ventricular size is normal.  3. The mitral valve is normal in structure. No evidence of mitral valve regurgitation. No evidence of mitral stenosis.  4. The aortic valve is normal in structure. Aortic valve regurgitation is not visualized. No aortic stenosis is present.  5. Evidence of atrial level shunting detected by color flow Doppler. FINDINGS  Left Ventricle: Left ventricular ejection fraction, by estimation, is 55 to 60%. The left ventricle has normal function. The left ventricle has no regional wall motion abnormalities. The left  ventricular internal cavity size was normal in size. There is  no left ventricular hypertrophy. Left ventricular diastolic function could not be evaluated. Right Ventricle: The right ventricular size is normal. Right vetricular wall thickness was not well visualized. Right ventricular systolic function is normal. Left Atrium: Left atrial size was normal in size. Right Atrium: Right atrial size was normal in size. Pericardium: There is no evidence of pericardial effusion. Mitral Valve: The mitral valve is normal in structure. No evidence of mitral valve regurgitation. No evidence of mitral valve stenosis. There is no evidence of mitral valve vegetation. Tricuspid Valve: The tricuspid valve is normal in structure. Tricuspid valve regurgitation is trivial. Aortic Valve: The aortic valve is normal in structure. Aortic valve regurgitation is not visualized. No aortic stenosis is present. Pulmonic Valve: The pulmonic valve was normal in structure. Pulmonic valve regurgitation is not visualized. Aorta: The aortic root and ascending aorta are structurally normal, with no evidence of dilitation. IAS/Shunts: Evidence of atrial level shunting detected by color flow Doppler.  LEFT VENTRICLE PLAX 2D LVIDd:         5.00 cm LVIDs:         3.70 cm LV PW:         1.00 cm LV IVS:        0.90 cm LVOT diam:     2.10 cm LV SV:         57 LV SV Index:   28 LVOT Area:     3.46 cm  LV Volumes (MOD) LV vol d, MOD A2C: 130.0 ml LV vol d, MOD A4C: 97.7 ml LV vol s, MOD A2C: 57.8 ml LV vol s, MOD A4C: 44.7 ml LV SV MOD A2C:     72.2 ml LV SV MOD A4C:     97.7 ml LV SV MOD BP:      66.3 ml RIGHT VENTRICLE RV S prime:     11.00 cm/s TAPSE (M-mode): 2.0 cm LEFT ATRIUM             Index        RIGHT ATRIUM           Index LA diam:        3.10 cm 1.52 cm/m   RA Area:     14.20 cm LA Vol (A2C):   32.1 ml 15.77 ml/m  RA Volume:   36.60 ml  17.98 ml/m LA Vol (A4C):   33.2 ml 16.31 ml/m LA Biplane Vol: 33.3 ml 16.36 ml/m  AORTIC VALVE LVOT Vmax:    90.80 cm/s LVOT Vmean:  56.100 cm/s LVOT VTI:    0.166 m  AORTA Ao Root diam: 3.00 cm MITRAL VALVE MV Area (PHT): 4.17 cm    SHUNTS MV  Decel Time: 182 msec    Systemic VTI:  0.17 m MV E velocity: 67.70 cm/s  Systemic Diam: 2.10 cm MV A velocity: 61.30 cm/s MV E/A ratio:  1.10 Mertie Moores MD Electronically signed by Mertie Moores MD Signature Date/Time: 05/27/2021/3:14:59 PM    Final    [place] EKG SITE RITE  Result Date: 05/29/2021 If Site Rite image not attached, placement could not be confirmed due to current cardiac rhythm.    Imaging independently reviewed in Epic.    Raynelle [facility] for Infectious [facility] [phone] pager 05/29/2021, 12:05 PM

## 2021-05-29 NOTE — Progress Notes (Signed)
PROGRESS NOTE  [name]  DOB: [birth year]  PCP: Martin[place], Sarah T, MD CC:[id number]  DOA: 05/26/2021  LOS: 3 days  Hospital Day: 4  Brief narrative: [name] is a 51 y.o. male with PMH significant for DM2, HTN. Patient initially presented to ED at [facility] with a fever of 103, blood sugar level elevated to 580, not in DKA.  He was noted to have an abscess in the left calcaneal area which was incised and drained with approximately 300 cc of pus drainage.  Blood culture and wound culture were obtained.  Blood culture Gram stain showed gram-positive cocci- staph aureus was detected by PCR.   Left foot x-rays showed marked severity of soft tissue swelling involving the left heel with extensive cortical destruction of the left calcaneus consistent with extensive osteomyelitis.  He was seen by podiatrist Dr. March Rummage.  He was then transferred to [facility] on 2/19 for further evaluation management by orthopedics Dr. Sharol Given.  2/19, MRI left foot showed evidence of septic joint and osteomyelitis. 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 Sharol Given.  Subjective: Patient was seen and examined this morning.  Pleasant middle-aged Caucasian male.  Sitting up in bed.  Not in distress.  He states he is overwhelmed with everything going on right now.  Because of which, he states he does not want to consider TEE as recommended at this time.  Principal Problem:   MSSA bacteremia Active Problems:   Insulin-requiring or dependent type II diabetes mellitus ([place])   Acute on chronic osteomyelitis of left foot with abscess    Pernicious anemia   Right shoulder pain   Essential hypertension   Sepsis due to methicillin susceptible Staphylococcus aureus (HCC)   Acute osteomyelitis of left calcaneus (HCC)   Assessment and Plan: Acute osteomyelitis and abscess of left calcaneus Septic arthritis of small joints of left foot Myositis of the foot muscles -Patient with fever, abscess in the  left calcaneal area and imaging evidence of extensive osteomyelitis as well as septic arthritis of the small joints of the left foot and myositis of the foot muscles. -2/21, underwent left transtibial amputation by Dr. Sharol Given. -Currently has wound VAC on.  To continue wound VAC postdischarge. -To follow-up with Dr. Sharol Given in the office in a week. -ID following  MSSA bacteremia -Blood culture obtained initially at [facility] ED grew MSSA  -Echocardiogram did not show any acute findings.  TEE was recommended but patient refused.   -Per ID recommendation, patient is currently on IV Ancef.   -No growth on repeat blood cultures sent from here on 2/19. -PICC line order placed today.  Outpatient antibiotic plan per ID.    Uncontrolled type 2 diabetes mellitus with hyperglycemia -A1c 9 on 2/19 -Home meds include Levemir 28 units daily at bedtime and sliding scale insulin. -Currently on insulin Levemir 20 units at bedtime and sliding scale insulin with Accu-Cheks -Blood sugar level frequently above 200.  I will increase Levemir dose tonight to his home requirement of 28 units. Recent Labs  Lab 05/28/21 1156 05/28/21 1606 05/28/21 1958 05/29/21 0749 05/29/21 1134  GLUCAP 187* 226* 284* 155* 257*    Pernicious anemia -Continue B12 and iron supplements.   -Hemoglobin stable Recent Labs    10/20/20 1431 10/21/20 0307 05/27/21 0232 05/28/21 0316 05/29/21 0433  HGB 12.8* 12.3* 11.5* 12.2* 11.1*  MCV 90.9 92.6 88.4 88.8 88.6   Right shoulder pain -Apparently twisted his arm a week or so ago with now limited range of motion  of the right shoulder with pain.  -X-ray right shoulder at [facility] were unremarkable per patient.  -Continue pain control.   -If no improvement, follow-up with orthopedics as an outpatient.   Essential hypertension -Blood pressure stable on losartan.    Mobility: PT eval pending Goals of care   Code Status: Full Code    Nutritional status:  Body mass index is 20.94  kg/m.      Diet:  Diet Order             Diet Carb Modified Fluid consistency: Thin; Room service appropriate? Yes  Diet effective now                   DVT prophylaxis:  SCD's Start: 05/28/21 1150 SCDs Start: 05/26/21 1816   Antimicrobials: IV Ancef Fluid: NS at 52 mill per hour Consultants: Orthopedics, ID Family Communication: None at bedside  Status is: Inpatient  Continue in-hospital care because: Pending PICC line insertion, pending PT eval Level of care: Med-Surg   Dispo: The patient is from: Home              Anticipated d/c is to:, Pending PT eval              Patient currently is not medically stable to d/c.   Difficult to place patient No     Infusions:   sodium chloride     sodium chloride 75 mL/hr at 05/28/21 1349    ceFAZolin (ANCEF) IV 2 g (05/29/21 0525)   magnesium sulfate bolus IVPB      Scheduled Meds:  vitamin C  1,000 mg Oral Daily   diclofenac Sodium  4 g Topical QID   docusate sodium  100 mg Oral Daily   ferrous sulfate  324 mg Oral Q breakfast   insulin aspart  0-20 Units Subcutaneous TID WC   insulin aspart  0-5 Units Subcutaneous QHS   insulin detemir  28 Units Subcutaneous QHS   losartan  25 mg Oral Daily   melatonin  5 mg Oral QHS   mupirocin ointment  1 application Nasal BID   nutrition supplement (JUVEN)  1 packet Oral BID BM   pantoprazole  40 mg Oral Daily   sodium chloride flush  3 mL Intravenous Q12H   vitamin B-12  1,000 mcg Oral Daily   zinc sulfate  220 mg Oral Daily    PRN meds: sodium chloride, acetaminophen **OR** acetaminophen, acetaminophen, alum & mag hydroxide-simeth, bisacodyl, diphenhydrAMINE, guaiFENesin-dextromethorphan, hydrALAZINE, HYDROmorphone (DILAUDID) injection, labetalol, magnesium citrate, magnesium sulfate bolus IVPB, metoprolol tartrate, ondansetron, ondansetron **OR** [DISCONTINUED] ondansetron (ZOFRAN) IV, oxyCODONE, oxyCODONE, phenol, polyethylene glycol, potassium chloride, sodium chloride  flush   Antimicrobials: Anti-infectives (From admission, onward)    Start     Dose/Rate Route Frequency Ordered Stop   05/28/21 1400  ceFAZolin (ANCEF) IVPB 2g/100 mL premix        2 g 200 mL/hr over 30 Minutes Intravenous Every 8 hours 05/28/21 1232     05/27/21 0900  vancomycin (VANCOREADY) IVPB 1500 mg/300 mL  Status:  Discontinued        1,500 mg 150 mL/hr over 120 Minutes Intravenous Every 12 hours 05/26/21 2001 05/27/21 0857   05/26/21 2200  ceFEPIme (MAXIPIME) 2 g in sodium chloride 0.9 % 100 mL IVPB  Status:  Discontinued        2 g 200 mL/hr over 30 Minutes Intravenous Every 8 hours 05/26/21 1850 05/28/21 1232   05/26/21 2000  metroNIDAZOLE (FLAGYL) IVPB 500 mg  Status:  Discontinued        500 mg 100 mL/hr over 60 Minutes Intravenous Every 12 hours 05/26/21 1820 05/28/21 1232   05/26/21 1945  vancomycin (VANCOREADY) IVPB 1750 mg/350 mL        1,750 mg 175 mL/hr over 120 Minutes Intravenous  Once 05/26/21 1850 05/27/21 0109       Objective: Vitals:   05/29/21 0532 05/29/21 0750  BP: 119/67 125/80  Pulse: 81 88  Resp: 17 19  Temp: 98.9 F (37.2 C) 97.8 F (36.6 C)  SpO2: 97% 96%    Intake/Output Summary (Last 24 hours) at 05/29/2021 1142 Last data filed at 05/29/2021 0900 Gross per 24 hour  Intake 479.36 ml  Output 3050 ml  Net -2570.64 ml   Filed Weights   05/26/21 1722  Weight: 76 kg   Weight change:  Body mass index is 20.94 kg/m.   Physical Exam: General exam: Middle-aged Caucasian male.  Not in distress.  Pain controlled Skin: No rashes, lesions or ulcers. HEENT: Atraumatic, normocephalic, no obvious bleeding Lungs: Clear to auscultation bilaterally CVS: Regular rate and rhythm, no murmur GI/Abd soft, nontender, nondistended, bowel sound present CNS: Alert, awake, oriented x3 Psychiatry: Mood appropriate Extremities: No pedal edema, no calf tenderness on the right.  Dressing over the left amputation stump.  Data Review: I have personally  reviewed the laboratory data and studies available.  F/u labs ordered Unresulted Labs (From admission, onward)     Start     Ordered   05/28/21 0500  CBC  Daily,   R     Question:  Specimen collection method  Answer:  Lab=Lab collect   05/27/21 0942   05/28/21 [id number]  Basic metabolic panel  Daily,   R     Question:  Specimen collection method  Answer:  Lab=Lab collect   05/27/21 [id number]            Signed, Terrilee Croak, MD [facility] 05/29/2021

## 2021-05-29 NOTE — Plan of Care (Signed)

## 2021-05-29 NOTE — Progress Notes (Signed)
Inpatient Rehab Admissions Coordinator:   Awaiting therapy evals/recs.  I will continue to follow.   Estill Dooms, PT, DPT Admissions Coordinator [phone] 05/29/21  1:54 PM

## 2021-05-29 NOTE — Progress Notes (Signed)
PT Cancellation Note  Patient Details Name: [name] MRN: [id number] DOB: June 09, 1970   Cancelled Treatment:    Reason Eval/Treat Not Completed: Other (comment). Pt just had UE PICC placed. Awaiting chest x-ray prior to mobilizing. Will plan to follow-up later once x-ray completed.   Raymond Gurney, PT, DPT Acute Rehabilitation Services  Pager: [phone] Office: [phone]    Jewel Baize 05/29/2021, 1:15 PM

## 2021-05-29 NOTE — Progress Notes (Signed)
Peripherally Inserted Central Catheter Placement  The IV Nurse has discussed with the patient and/or persons authorized to consent for the patient, the purpose of this procedure and the potential benefits and risks involved with this procedure.  The benefits include less needle sticks, lab draws from the catheter, and the patient may be discharged home with the catheter. Risks include, but not limited to, infection, bleeding, blood clot (thrombus formation), and puncture of an artery; nerve damage and irregular heartbeat and possibility to perform a PICC exchange if needed/ordered by physician.  Alternatives to this procedure were also discussed.  Bard Power PICC patient education guide, fact sheet on infection prevention and patient information card has been provided to patient /or left at bedside.    PICC Placement Documentation  PICC Single Lumen 05/29/21 Left Brachial 44 cm 0 cm (Active)  Indication for Insertion or Continuance of Line Home intravenous therapies (PICC only) 05/29/21 1200  Exposed Catheter (cm) 0 cm 05/29/21 1200  Site Assessment Clean, Dry, Intact 05/29/21 1200  Line Status Flushed;Saline locked;Blood return noted 05/29/21 1200  Dressing Type Securing device;Transparent 05/29/21 1200  Dressing Status Antimicrobial disc in place 05/29/21 1200  Safety Lock Not Applicable 05/29/21 1200  Line Care Connections checked and tightened 05/29/21 1200  Dressing Intervention New dressing 05/29/21 1200  Dressing Change Due 06/06/21 05/29/21 1200       Franne Grip Renee 05/29/2021, 12:09 PM

## 2021-05-29 NOTE — Progress Notes (Signed)
Inpatient Diabetes Program Recommendations  AACE/ADA: New Consensus Statement on Inpatient Glycemic Control (2015)  Target Ranges:  Prepandial:   less than 140 mg/dL      Peak postprandial:   less than 180 mg/dL (1-2 hours)      Critically ill patients:  140 - 180 mg/dL   Lab Results  Component Value Date   GLUCAP 155 (H) 05/29/2021   HGBA1C 9.0 (H) 05/26/2021    Review of Glycemic Control  Latest Reference Range & Units 05/28/21 08:01 05/28/21 10:13 05/28/21 11:56 05/28/21 16:06 05/28/21 19:58 05/29/21 07:49  Glucose-Capillary 70 - 99 mg/dL 417 (H)  Novolog 7 units 166 (H) 187 (H)  Novolog 4 units 226 (H)  Novolog 7 units 284 (H)  Novolog 3 units 155 (H)  Novolog 4 units   Diabetes history: DM 2 Outpatient Diabetes medications: Levemir 28 units, Humalog 0-15 units Current orders for Inpatient glycemic control:  Levemir 20 units qhs Novolog 0-20 units tid + hs  A1c 9% on 2/19  Inpatient Diabetes Program Recommendations:   Glucose trends lower.  -  consider Novolog 3 units tid meal coverage  Thanks,  Christena Deem RN, MSN, BC-ADM Inpatient Diabetes Coordinator Team Pager [phone] (8a-5p)

## 2021-05-30 LAB — CBC
HCT: 33.3 % — ABNORMAL LOW (ref 39.0–52.0)
Hemoglobin: 11.2 g/dL — ABNORMAL LOW (ref 13.0–17.0)
MCH: 30 pg (ref 26.0–34.0)
MCHC: 33.6 g/dL (ref 30.0–36.0)
MCV: 89.3 fL (ref 80.0–100.0)
Platelets: 478 10*3/uL — ABNORMAL HIGH (ref 150–400)
RBC: 3.73 MIL/uL — ABNORMAL LOW (ref 4.22–5.81)
RDW: 11.4 % — ABNORMAL LOW (ref 11.5–15.5)
WBC: 9.4 10*3/uL (ref 4.0–10.5)
nRBC: 0 % (ref 0.0–0.2)

## 2021-05-30 LAB — GLUCOSE, CAPILLARY
Glucose-Capillary: 102 mg/dL — ABNORMAL HIGH (ref 70–99)
Glucose-Capillary: 177 mg/dL — ABNORMAL HIGH (ref 70–99)
Glucose-Capillary: 206 mg/dL — ABNORMAL HIGH (ref 70–99)

## 2021-05-30 LAB — BASIC METABOLIC PANEL
Anion gap: 7 (ref 5–15)
BUN: 8 mg/dL (ref 6–20)
CO2: 32 mmol/L (ref 22–32)
Calcium: 8.8 mg/dL — ABNORMAL LOW (ref 8.9–10.3)
Chloride: 95 mmol/L — ABNORMAL LOW (ref 98–111)
Creatinine, Ser: 0.63 mg/dL (ref 0.61–1.24)
GFR, Estimated: >60 mL/min (ref >60)
Glucose, Bld: 107 mg/dL — ABNORMAL HIGH (ref 70–99)
Potassium: 3.9 mmol/L (ref 3.5–5.1)
Sodium: 134 mmol/L — ABNORMAL LOW (ref 135–145)

## 2021-05-30 MED ORDER — SENNA 8.6 MG PO TABS: 1.0000 | ORAL_TABLET | Freq: Every day | ORAL | 0 refills | Status: AC

## 2021-05-30 MED ORDER — CEFAZOLIN IV (FOR PTA / DISCHARGE USE ONLY): 2.0000 g | Freq: Three times a day (TID) | INTRAVENOUS | 0 refills | Status: AC

## 2021-05-30 MED ORDER — OXYCODONE-ACETAMINOPHEN 5-325 MG PO TABS: 1.0000 | ORAL_TABLET | Freq: Four times a day (QID) | ORAL | 0 refills | Status: AC | PRN

## 2021-05-30 MED ORDER — JUVEN PO PACK: 1.0000 | PACK | Freq: Two times a day (BID) | ORAL | 0 refills | Status: AC

## 2021-05-30 NOTE — Evaluation (Signed)
Physical Therapy Evaluation Patient Details Name: [name] MRN: [id number] DOB: April 03, 1971 Today's Date: 05/30/2021  History of Present Illness  Pt is a 51 y.o. male admitted 05/26/21 with hyperglycemia, also found to have L calcaneal abscess. 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 TEE recommended, pt declined. Pt also with R shoulder pain, imaging at [facility] negative for acute injury per pt. PMH includes DM2, HTN.   Clinical Impression  Pt presents with an overall decrease in functional mobility secondary to above. PTA, pt independent, reports living with ex-wife who is available to assist upon return home. Initiated educ re: L BKA precautions, positioning, limb guard wear, therex, fall risk reduction. Today, pt able to transfer and ambulate with RW and min guard. Pt reports hopeful for d/c home as soon as possible; p reports confidence he can manage everything (new amputation, PICC line, wound vac...) with assist from ex-wife. Pt would benefit from continued acute PT services to maximize functional mobility and independence prior to d/c with HHPT services.     Recommendations for follow up therapy are one component of a multi-disciplinary discharge planning process, led by the attending physician.  Recommendations may be updated based on patient status, additional functional criteria and insurance authorization.  Follow Up Recommendations Home health PT    Assistance Recommended at Discharge Intermittent Supervision/Assistance  Patient can return home with the following  A little help with walking and/or transfers;A little help with bathing/dressing/bathroom;Assistance with cooking/housework;Assist for transportation;Help with stairs or ramp for entrance    Equipment Recommendations Rolling walker (2 wheels);Wheelchair (measurements PT);Wheelchair cushion (measurements PT)  Recommendations for Other Services     Occupational Therapy   Functional Status Assessment Patient has had a recent decline in their functional status and demonstrates the ability to make significant improvements in function in a reasonable and predictable amount of time.     Precautions / Restrictions Precautions Precautions: Fall;Other (comment) Precaution Comments: L residual limb wound vac Required Braces or Orthoses: Other Brace Other Brace: L BKA limb guard      Mobility  Bed Mobility Overal bed mobility: Modified Independent             General bed mobility comments: HOB elevated    Transfers Overall transfer level: Needs assistance Equipment used: Rolling walker (2 wheels) Transfers: Sit to/from Stand Sit to Stand: Min guard           General transfer comment: cues for hand placement and sequencing; multiple sit<>stands from EOB and recliner with min guard for balance    Ambulation/Gait Ambulation/Gait assistance: Min guard Gait Distance (Feet): 36 Feet Assistive device: Rolling walker (2 wheels)   Gait velocity: Decreased     General Gait Details: Slow, mildly unsteady hop-to gait on RLE with RW and min guard for balance; cues for sequencing with RW  Stairs Stairs:  (pt declined - reports he will plan to scoot up stairs on his bottom, has sturdy rail to pull to standing once at top of stairs; encouraged guarding from his ex-wife)          Wheelchair Mobility    Modified Rankin (Stroke Patients Only)       Balance Overall balance assessment: Needs assistance   Sitting balance-Leahy Scale: Good Sitting balance - Comments: indep to don R sock   Standing balance support: Reliant on assistive device for balance Standing balance-Leahy Scale: Poor  Pertinent Vitals/Pain Pain Assessment Pain Assessment: Faces Faces Pain Scale: Hurts little more Pain Location: L residual limb Pain Descriptors / Indicators: Throbbing, Burning Pain  Intervention(s): Monitored during session, RN gave pain meds during session, Limited activity within patient's tolerance    Home Living Family/patient expects to be discharged to:: Private residence Living Arrangements: Spouse/significant other Available Help at Discharge: Family Type of Home: Mobile home Home Access: Stairs to enter Entrance Stairs-Rails: Doctor, general practice of Steps: 4   Home Layout: One level Home Equipment: Cane - single point;Crutches Additional Comments: Reports he lives with ex-wife who can be available for 24/7 assist    Prior Function Prior Level of Function : Independent/Modified Independent             Mobility Comments: Typically indep without DME, drives       Hand Dominance        Extremity/Trunk Assessment   Upper Extremity Assessment Upper Extremity Assessment: Overall WFL for tasks assessed    Lower Extremity Assessment Lower Extremity Assessment: LLE deficits/detail LLE Deficits / Details: s/p L BKA; hip functionally >3/5 strength, knee at least 3/5 though lacking full flexion with expected post-op pain    Cervical / Trunk Assessment Cervical / Trunk Assessment: Normal  Communication   Communication: No difficulties  Cognition Arousal/Alertness: Awake/alert Behavior During Therapy: WFL for tasks assessed/performed, Restless Overall Cognitive Status: No family/caregiver present to determine baseline cognitive functioning                                 General Comments: WFL for majority of simple tasks; some decreased attention noted. Adamant about d/c home today if able, "I'll do whatever I need to do to make it work... I'll make it work, don't you worry"        General Comments General comments (skin integrity, edema, etc.): Initiated amputee educ re: precautions, postioning, knee ROM, limb guard wear, wound vac use    Exercises Amputee Exercises Quad Sets: AROM, Left, Seated Knee Flexion:  AROM, Left, Seated Knee Extension: AROM, Left, Seated   Assessment/Plan    PT Assessment Patient needs continued PT services  PT Problem List Decreased strength;Decreased range of motion;Decreased activity tolerance;Decreased balance;Decreased mobility;Decreased knowledge of use of DME;Decreased knowledge of precautions;Pain       PT Treatment Interventions DME instruction;Gait training;Stair training;Functional mobility training;Therapeutic activities;Therapeutic exercise;Balance training;Patient/family education;Wheelchair mobility training    PT Goals (Current goals can be found in the Care Plan section)  Acute Rehab PT Goals Patient Stated Goal: home as soon as possible PT Goal Formulation: With patient Time For Goal Achievement: 06/13/21 Potential to Achieve Goals: Good    Frequency Min 5X/week     Co-evaluation               AM-PAC PT "6 Clicks" Mobility  Outcome Measure Help needed turning from your back to your side while in a flat bed without using bedrails?: None Help needed moving from lying on your back to sitting on the side of a flat bed without using bedrails?: None Help needed moving to and from a bed to a chair (including a wheelchair)?: A Little Help needed standing up from a chair using your arms (e.g., wheelchair or bedside chair)?: A Little Help needed to walk in hospital room?: A Little Help needed climbing 3-5 steps with a railing? : A Little 6 Click Score: 20    End of Session Equipment Utilized During  Treatment: Gait belt Activity Tolerance: Patient tolerated treatment well Patient left: in chair;with call bell/phone within reach Nurse Communication: Mobility status PT Visit Diagnosis: Other abnormalities of gait and mobility (R26.89);Pain Pain - Right/Left: Left Pain - part of body: Leg    Time: 8341-9622 PT Time Calculation (min) (ACUTE ONLY): 28 min   Charges:   PT Evaluation $PT Eval Moderate Complexity: 1 Mod        Ina Homes, PT, DPT Acute Rehabilitation Services  Pager [phone] Office [phone]  Malachy Chamber 05/30/2021, 9:28 AM

## 2021-05-30 NOTE — Progress Notes (Signed)
Physical Therapy Treatment Patient Details Name: [name] MRN: [id number] DOB: 06-May-1970 Today's Date: 05/30/2021   History of Present Illness Pt is a 51 y.o. male admitted 05/26/21 with hyperglycemia, also found to have L calcaneal abscess. 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 TEE recommended, pt declined. Pt also with R shoulder pain, imaging at [facility] negative for acute injury per pt. PMH includes DM2, HTN.   PT Comments    Pt seen for additional session for further education/therex in preparation for return home; noted pt may not be able to have follow-up therapy services upon return home. Pt receptive to HEP, reports no questions or concerns; declines additional OOB mobility at this time. Will continue to follow acutely to address established goals.    Recommendations for follow up therapy are one component of a multi-disciplinary discharge planning process, led by the attending physician.  Recommendations may be updated based on patient status, additional functional criteria and insurance authorization.  Follow Up Recommendations  Home health PT     Assistance Recommended at Discharge Intermittent Supervision/Assistance  Patient can return home with the following A little help with walking and/or transfers;A little help with bathing/dressing/bathroom;Assistance with cooking/housework;Assist for transportation;Help with stairs or ramp for entrance   Equipment Recommendations  Rolling walker (2 wheels);Wheelchair (measurements PT);Wheelchair cushion (measurements PT)    Recommendations for Other Services       Precautions / Restrictions Precautions Precautions: Fall;Other (comment) Precaution Comments: L residual limb wound vac Required Braces or Orthoses: Other Brace Other Brace: L BKA limb guard Restrictions Weight Bearing Restrictions: Yes LLE Weight Bearing: Non weight bearing     Mobility  Bed  Mobility Overal bed mobility: Modified Independent             General bed mobility comments: able to come to long sit and reposition self from near flat [facility]    Transfers           General transfer comment: pt declined additional OOB                 Wheelchair Mobility    Modified Rankin (Stroke Patients Only)       Balance Overall balance assessment: Needs assistance   Sitting balance-Leahy Scale: Good Sitting balance - Comments: indep to don R sock   Standing balance support: Reliant on assistive device for balance Standing balance-Leahy Scale: Poor                              Cognition Arousal/Alertness: Awake/alert Behavior During Therapy: WFL for tasks assessed/performed, Restless Overall Cognitive Status: No family/caregiver present to determine baseline cognitive functioning                                 General Comments: WFL for majority of simple tasks; some decreased attention noted; continues to state, "I'll make it work, don't you worry"        Exercises Amputee Exercises Quad Sets: AROM, Left, Seated Knee Flexion: AROM, Left, Seated Knee Extension: AROM, Left, Seated Other Exercises Other Exercises: [employer] HEP handout (Access Code [email or web address]) provided - knee flex/ext, chair push-ups, hip abd/ext/flex, quad sets, supine bridge on RLE with L knee extension    General Comments General comments (skin integrity, edema, etc.): session focused on additional education (especially therex/HEP) in preparation for return home tomorrow since pt  unable to receive HHPT services      Pertinent Vitals/Pain Pain Assessment Pain Assessment: Faces Faces Pain Scale: Hurts a little bit Pain Location: L residual limb Pain Descriptors / Indicators: Sore Pain Intervention(s): Monitored during session    Home Living Family/patient expects to be discharged to:: Private residence Living Arrangements: Spouse/significant  other Available Help at Discharge: Family;Available 24 hours/day Type of Home: Mobile home Home Access: Stairs to enter Entrance Stairs-Rails: Doctor, general practice of Steps: 4   Home Layout: One level Home Equipment: Cane - single point;Crutches Additional Comments: Reports he lives with ex-wife who can be available for 24/7 assist    Prior Function            PT Goals (current goals can now be found in the care plan section) Acute Rehab PT Goals Patient Stated Goal: home as soon as possible PT Goal Formulation: With patient Time For Goal Achievement: 06/13/21 Potential to Achieve Goals: Good Additional Goals Additional Goal #1: Patient will self-propel manual wheelchair mod indep 150' while demonstrating safe use of wheelchair components Progress towards PT goals: Progressing toward goals    Frequency    Min 5X/week      PT Plan Current plan remains appropriate    Co-evaluation              AM-PAC PT "6 Clicks" Mobility   Outcome Measure  Help needed turning from your back to your side while in a flat bed without using bedrails?: None Help needed moving from lying on your back to sitting on the side of a flat bed without using bedrails?: None Help needed moving to and from a bed to a chair (including a wheelchair)?: A Little Help needed standing up from a chair using your arms (e.g., wheelchair or bedside chair)?: A Little Help needed to walk in hospital room?: A Little Help needed climbing 3-5 steps with a railing? : A Little 6 Click Score: 20    End of Session Equipment Utilized During Treatment: Gait belt Activity Tolerance: Patient tolerated treatment well Patient left: in bed;with call bell/phone within reach Nurse Communication: Mobility status PT Visit Diagnosis: Other abnormalities of gait and mobility (R26.89);Pain Pain - Right/Left: Left Pain - part of body: Leg     Time: 6301-6010 PT Time Calculation (min) (ACUTE ONLY): 12  min  Charges:  $Therapeutic Exercise: 8-22 mins                     Ina Homes, PT, DPT Acute Rehabilitation Services  Pager [phone] Office [phone]  Malachy Chamber 05/30/2021, 12:32 PM

## 2021-05-30 NOTE — Plan of Care (Signed)

## 2021-05-30 NOTE — Progress Notes (Signed)
Inpatient Rehab Admissions Coordinator:   Note therapy recommending [place].  Will sign off for CIR at this time.    Shann Medal, PT, DPT Admissions Coordinator [phone] 05/30/21  10:23 AM

## 2021-05-30 NOTE — Discharge Summary (Signed)
° °Physician Discharge Summary  °[name] MRN:[id number] DOB: 02/11/1971 DOA: 05/26/2021 ° °PCP: Jordan, Sarah T, MD ° °Admit date: 05/26/2021 °Discharge date: 05/30/2021 ° °Admitted From: Home °Discharge disposition: Home with home health services and wound vac ° °Recommendations at discharge:  °Complete the 6 weeks course of IV antibiotics, end date 07/09/21 °Labs every week to be faxed to ID °Follow-up with ID and orthopedics ° ° °Brief narrative: °[name] is a 51 y.o. male with PMH significant for DM2, HTN. °Patient initially presented to ED at [facility] with a fever of 103, blood sugar level elevated to 580, not in DKA.  He was noted to have an abscess in the left calcaneal area which was incised and drained with approximately 300 cc of pus drainage.  Blood culture and wound culture were obtained.  Blood culture Gram stain showed gram-positive cocci- staph aureus was detected by PCR.   °Left foot x-rays showed marked severity of soft tissue swelling involving the left heel with extensive cortical destruction of the left calcaneus consistent with extensive osteomyelitis.  He was seen by podiatrist Dr. Price.  He was then transferred to [place] on 2/19 for further evaluation management by orthopedics Dr. Duda.  °2/19, MRI left foot showed evidence of septic joint and osteomyelitis. 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 ° °Subjective: °Patient was seen and examined this morning.  Pleasant middle-aged Caucasian male.   °Worked with PT this morning.  Able to get up and sit on the chair.   °Home health PT recommended.  Patient states he lives at home with his wife. 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 °-2/21, underwent left transtibial amputation by Dr. Duda. °-Currently has wound VAC on.  To continue wound VAC postdischarge. °-To follow-up with Dr. Duda in the office in a week. °-ID consult appreciated. ° °MSSA bacteremia °-Blood culture obtained initially at [place] [facility] grew MSSA  °-Echocardiogram did not show any acute findings.  TEE was recommended but patient refused.   °-No growth on repeat blood cultures sent from here on 2/19. °-PICC line placed on 2/22.  Currently on IV Ancef.  Per ID recommendation, patient will continue IV Ancef for a duration of 6 weeks (EOT 07/09/21).  Patient will follow-up with ID as an outpatient.  Weekly CBC and BMP to be followed by ID. ° °Uncontrolled type 2 diabetes mellitus with hyperglycemia °-A1c 9 on 2/19 °-Home meds include Levemir 28 units daily at bedtime and sliding scale insulin. °-Currently getting same.  Continue the same regimen at home. °Recent Labs  °Lab 05/29/21 °0749 05/29/21 °1134 05/29/21 °1623 05/29/21 °1936 05/30/21 °0639  °GLUCAP 155* 257* 192* 258* 102*  °  °Pernicious anemia °-Continue B12 and iron supplements.   °-Hemoglobin stable °Recent Labs  °  10/21/20 °0307 05/27/21 °0232 05/28/21 °0316 05/29/21 °  05/29/21 0433 05/30/21 0353  HGB 12.3* 11.5* 12.2* 11.1* 11.2*  MCV 92.6 88.4 88.8 88.6 89.3   Right shoulder pain -Apparently twisted his arm a week or so ago with now limited range of motion of the right shoulder with pain.  -X-[facility] shoulder at [facility] were unremarkable per patient.  -Continue pain control.   -If no improvement, follow-up  with orthopedics as an outpatient.   Essential hypertension -Blood pressure stable on losartan.    Wounds: - Wound / Incision (Open or Dehisced) 10/20/20 Diabetic ulcer Toe (Comment  which one) Anterior;Left pink in color, toe swollen and red, no discharge (Active)  Date First Assessed/Time First Assessed: 10/20/20 1820   Wound Type: Diabetic ulcer  Location: Toe (Comment  which one)  Location Orientation: Anterior;Left  Wound Description (Comments): pink in color, toe swollen and red, no discharge  Present on Ad...    Assessments 10/20/2020  5:54 PM 10/21/2020  7:26 AM  Dressing Type Other (Comment) Other (Comment)  Drainage Amount None --  Treatment Other (Comment) --     No Linked orders to display     Wound / Incision (Open or Dehisced) 10/20/20 Other (Comment) Toe (Comment  which one) Anterior;Left sutures from previous toe amputation (Active)  Date First Assessed/Time First Assessed: 10/20/20 1824   Wound Type: Other (Comment)  Location: Toe (Comment  which one)  Location Orientation: Anterior;Left  Wound Description (Comments): sutures from previous toe amputation  Present on Admission: Yes    Assessments 10/20/2020  6:26 PM 10/21/2020  7:26 AM  Dressing Type -- None  Wound Length (cm) 3 cm --  Wound Width (cm) 0 cm --  Wound Surface Area (cm^2) 0 cm^2 --     No Linked orders to display     Incision (Closed) 10/21/20 Foot Left (Active)  Date First Assessed/Time First Assessed: 10/21/20 1046   Location: Foot  Location Orientation: Left    Assessments 10/21/2020 11:06 AM 10/21/2020 11:40 AM  Dressing Type Other (Comment) Other (Comment);Compression wrap  Dressing Clean;Dry;Intact Clean;Dry;Intact  Site / Wound Assessment Dressing in place / Unable to assess Dry;Clean  Drainage Amount None None     No Linked orders to display     Pressure Injury 05/26/21 Toe (Comment  which one) Right Unstageable - Full thickness tissue loss in which the base of the injury is covered by slough  (yellow, tan, gray, green or brown) and/or eschar (tan, brown or black) in the wound bed. (Active)  Date First Assessed/Time First Assessed: 05/26/21 1930   Location: (c) Toe (Comment  which one)  Location Orientation: Right  Staging: Unstageable - Full thickness tissue loss in which the base of the injury is covered by slough (yellow, tan, gray, gr...    Assessments 05/26/2021  8:04 PM 05/29/2021  8:00 PM  Dressing Type None Negative pressure wound therapy  Dressing -- Clean, Dry, Intact  Site / Wound Assessment Black;Brown Dressing in place / Unable to assess  Peri-wound Assessment Intact --  Margins Unattached edges (unapproximated) --  Drainage Amount None --     No Linked orders to display     Wound / Incision (Open or Dehisced) 05/26/21 Incision - Open Heel Right I/D to right heel (Active)  Date First Assessed/Time First Assessed: 05/26/21 1930   Wound Type: Incision - Open  Location: Heel  Location Orientation: Right  Wound Description (Comments): I/D to right heel  Present on Admission: Yes    Assessments 05/26/2021  8:04 PM 05/29/2021 10:00 AM  Type Gauze (Comment) --  °Dressing Status Clean, Dry, Intact --  °Site / Wound Assessment Dressing in place / Unable to assess --  °Wound Length (cm) -- 0 cm  °Wound Width (cm) -- 0 cm  °Wound Depth (cm) -- 0 cm  °Wound Volume (cm^3) -- 0 cm^3  °Wound Surface Area (cm^2) -- 0 cm^2  °   °No Linked orders to display  °   °Negative Pressure Wound Therapy Leg Left (Active)  °Placement Date/Time: 05/28/21 0955   Wound Type: Diabetic foot ulcer  Location: Leg  Location Orientation: Left  °  °Assessments 05/28/2021 10:15 AM 05/29/2021  8:00 PM  °Last dressing change 05/28/21 --  °Site / Wound Assessment Dressing in place / Unable to assess Dressing in place / Unable to assess  °Cycle Continuous;On Continuous  °Target Pressure (mmHg) 125 125  °   °No Linked orders to display  ° ° °Discharge Exam:  ° °Vitals:  ° 05/29/21 1644 05/29/21 1937 05/30/21 0425  05/30/21 0727  °BP: 134/71 135/74 133/78 130/73  °Pulse: 80 82 79 87  °Resp: 18 18    °Temp: 98 °F (36.7 °C) 98.2 °F (36.8 °C)  99.3 °F (37.4 °C)  °TempSrc: Oral Oral  Oral  °SpO2: 97% 98% 96% 96%  °Weight:      °Height:      ° ° °Body mass index is 20.94 kg/m².  ° °General exam: Middle-aged Caucasian male.  Not in distress.  Pain controlled °Skin: No rashes, lesions or ulcers. °HEENT: Atraumatic, normocephalic, no obvious bleeding °Lungs: Clear to auscultation bilaterally °CVS: Regular rate and rhythm, no murmur °GI/Abd soft, nontender, nondistended, bowel sound present °CNS: Alert, awake, oriented x3 °Psychiatry: Mood appropriate °Extremities: No pedal edema, no calf tenderness on the right.  Dressing over the left amputation stump. ° °Follow ups:  ° ° Follow-up Information   ° ° Duda, Marcus V, MD Follow up in 1 week(s).   °Specialty: Orthopedic Surgery °Contact information: °[address] °[place] [address] °[phone] ° ° °  °  ° ° Wallace, Andrew N, DO Follow up.   °Specialties: Infectious Diseases, Internal Medicine °Contact information: °[address] °[address] °[place] [address] °[phone] ° ° °  °  ° °  °  ° °  ° ° °Discharge Instructions:  ° °Discharge Instructions   ° ° [facility] pharmacist to adjust dose for Vancomycin, Aminoglycosides and other anti-infective therapies as requested by physician.   Complete by: As directed °  ° [facility] to provide Cath Flo 2mg   Complete by: As directed °  ° Administer for PICC line occlusion and as ordered by physician for other access device issues.  ° Anaphylaxis Kit: Provided to treat any anaphylactic reaction to the medication being provided to the patient if First Dose or when requested by physician   Complete by: As directed °  ° Epinephrine 1mg/ml vial / amp: Administer 0.3mg (0.3ml) subcutaneously once for moderate to severe anaphylaxis, nurse to call physician and pharmacy when reaction occurs and call 911 if needed  for immediate care  ° Diphenhydramine 50mg/ml IV vial: Administer 25-50mg IV/IM PRN for first dose reaction, rash, itching, mild reaction, nurse to call physician and pharmacy when reaction occurs  ° Sodium Chloride 0.9% NS 500ml IV: Administer if needed for hypovolemic blood pressure drop or as ordered by physician after call to physician with anaphylactic reaction  ° Call MD for:  difficulty breathing, headache or visual disturbances   Complete   Complete by: As directed    Call MD for:  extreme fatigue   Complete by: As directed    Call MD for:  hives   Complete by: As directed    Call MD for:  persistant dizziness or light-headedness   Complete by: As directed    Call MD for:  persistant nausea and vomiting   Complete by: As directed    Call MD for:  severe uncontrolled pain   Complete by: As directed    Call MD for:  temperature >100.4   Complete by: As directed    Change dressing on IV access line weekly and PRN   Complete by: As directed    Diet Carb Modified   Complete by: As directed    Discharge instructions   Complete by: As directed     Complete the 6 weeks course of IV antibiotics, end date 07/09/21  Labs every week to be faxed to ID  Follow-up with ID and orthopedics  Discharge instructions for diabetes mellitus: Check blood sugar 3 times a day and bedtime at home. If blood sugar running above 200 or less than 70 please call your MD to adjust insulin. If you notice signs and symptoms of hypoglycemia (low blood sugar) like jitteriness, confusion, thirst, tremor and sweating, please check blood sugar, drink sugary drink/biscuits/sweets to increase sugar level and call MD or return to ER.    General discharge instructions: Follow with Primary MD Martinique, Sarah T, MD in 7 days  Please request your PCP  to go over your hospital tests, procedures, radiology results at the follow up. Please get your medicines reviewed and adjusted.  Your PCP may decide to repeat certain labs or tests as  needed. Do not drive, operate heavy machinery, perform activities at heights, swimming or participation in water activities or provide baby sitting services if your were admitted for syncope or siezures until you have seen by Primary MD or a Neurologist and advised to do so again. [place] Controlled Substance Reporting System database was reviewed. Do not drive, operate heavy machinery, perform activities at heights, swim, participate in water activities or provide baby-sitting services while on medications for pain, sleep and mood until your outpatient physician has reevaluated you and advised to do so again.  You are strongly recommended to comply with the dose, frequency and duration of prescribed medications. Activity: As tolerated with Full fall precautions use walker/cane & assistance as needed Avoid using any recreational substances like cigarette, tobacco, alcohol, or non-prescribed drug. If you experience worsening of your admission symptoms, develop shortness of breath, life threatening emergency, suicidal or homicidal thoughts you must seek medical attention immediately by calling 911 or calling your MD immediately  if symptoms less severe. You must read complete instructions/literature along with all the possible adverse reactions/side effects for all the medicines you take and that have been prescribed to you. Take any new medicine only after you have completely understood and accepted all the possible adverse reactions/side effects.  Wear Seat belts while driving. You were cared for by a hospitalist during your hospital stay. If you have any questions about your discharge medications or the care you received while you were in the hospital after you are discharged, you can call the unit and ask to speak with the hospitalist or the covering physician. Once you are discharged, your primary care physician will handle any further medical issues. Please note that NO REFILLS for any discharge  medications will be authorized once you are  as it is imperative that you return to your primary care physician (or establish a relationship with a primary care physician if you do not have one).  ° Discharge wound care:   Complete by: As directed °  ° Wound vac  ° Flush IV access with Sodium Chloride 0.9% and Heparin 10 units/ml or 100 units/ml   Complete by: As directed °  ° Home infusion instructions - [facility]   Complete by: As directed °  ° Instructions: Flush IV access with Sodium Chloride 0.9% and Heparin 10units/ml or 100units/ml  ° Change dressing on IV access line: Weekly and PRN  ° Instructions Cath Flo 2mg: Administer for PICC Line occlusion and as ordered by physician for other access device  ° [facility] pharmacist to adjust dose for: Vancomycin, Aminoglycosides and other anti-infective therapies as requested by physician  ° Increase activity slowly   Complete by: As directed °  ° Method of administration may be changed at the discretion of home infusion pharmacist based upon assessment of the patient and/or caregiver’s ability to self-administer the medication ordered   Complete by: As directed °  ° Negative Pressure Wound Therapy - Incisional   Complete by: As directed °  ° Attach to prevena plus at discharge from the floor  ° °  ° ° °Discharge Medications:  ° °Allergies as of 05/30/2021   ° °   Reactions  ° Piperacillin Hives  ° Tolerated Cefepime and Cefazolin Feb 2023  ° Tazobactam Hives  ° °  ° °  °Medication List  °  ° °STOP taking these medications   ° °Basaglar KwikPen 100 UNIT/ML °  °doxycycline 100 MG tablet °Commonly known as: VIBRA-TABS °  °silver sulfADIAZINE 1 % cream °Commonly known as: Silvadene °  ° °  ° °TAKE these medications   ° °calcium carbonate 1500 (600 Ca) MG Tabs tablet °Commonly known as: OSCAL °Take 600 mg of elemental calcium by mouth daily with breakfast. °  °ceFAZolin  IVPB °Commonly known as: ANCEF °Inject 2 g into the vein every 8  (eight) hours. Indication:  MSSA bacteremia °First Dose: Yes °Last Day of Therapy:  07/07/21 °Labs - Once weekly:  CBC/D and BMP, °Labs - Every other week:  ESR and CRP °Method of administration: IV Push °Pull PICC line at the end of IV therapy °Method of administration may be changed at the discretion of home infusion pharmacist based upon assessment of the patient and/or caregiver's ability to self-administer the medication ordered. °  °diclofenac Sodium 1 % Gel °Commonly known as: VOLTAREN °Apply 2 g topically daily as needed (pain). °  °ferrous sulfate 324 MG Tbec °324 mg daily with breakfast. °  °HumaLOG KwikPen 100 UNIT/ML KwikPen °Generic drug: insulin lispro °0-15 Units 3 (three) times daily. Sliding scale; patient not sure about scale. °  °insulin detemir 100 UNIT/ML FlexPen °Commonly known as: LEVEMIR °Inject 28 Units into the skin at bedtime. °  °losartan 25 MG tablet °Commonly known as: COZAAR °Take 25 mg by mouth daily. °  °meloxicam 7.5 MG tablet °Commonly known as: MOBIC °Take 7.5 mg by mouth daily as needed for pain. °  °methocarbamol 500 MG tablet °Commonly known as: ROBAXIN °Take 500 mg by mouth 3 (three) times daily. °  °nutrition supplement (JUVEN) Pack °Take 1 packet by mouth 2 (two) times daily between meals. °  °oxyCODONE-acetaminophen 5-325 MG tablet °Commonly known as: Percocet °Take 1 tablet by mouth every 6 (six) hours as needed for up to   5 days for severe pain. °  °Potassium 99 MG Tabs °Take 99 mg by mouth daily. °  °PROBIOTIC ACIDOPHILUS PO °Take 1 capsule by mouth daily. °  °senna 8.6 MG Tabs tablet °Commonly known as: SENOKOT °Take 1 tablet (8.6 mg total) by mouth daily. °  °sildenafil 20 MG tablet °Commonly known as: REVATIO °Take 20 mg by mouth daily as needed (erectile dysfunction). °  °vitamin B-12 1000 MCG tablet °Commonly known as: CYANOCOBALAMIN °Take 1,000 mcg by mouth daily. °  ° °  ° °  °  ° ° °  °Discharge Care Instructions  °(From admission, onward)  °  ° ° °  ° °  Start      Ordered  ° 05/30/21 0000  Change dressing on IV access line weekly and PRN  (Home infusion instructions - [facility] )       ° 05/30/21 1013  ° 05/30/21 0000  Discharge wound care:       °Comments: Wound vac  ° 05/30/21 1013  ° °  °  ° °  ° ° ° °The results of significant diagnostics from this hospitalization (including imaging, microbiology, ancillary and laboratory) are listed below for reference.   ° °Procedures and Diagnostic Studies:  ° °MR FOOT LEFT WO CONTRAST ° °Result Date: 05/27/2021 °CLINICAL DATA:  Evaluate for osteomyelitis of the foot. History of diabetes. Transfer from [place] [facility]. Left calcaneal abscess with approximally 300 mL of pus drainage. Fever of 103. Gram-positive cocci staph aureus detected by PCR. Status post surgery on left heel 05/2019. EXAM: MRI OF THE LEFT FOOT WITHOUT CONTRAST TECHNIQUE: Multiplanar, multisequence MR imaging of the left hindfoot was performed. No intravenous contrast was administered. COMPARISON:  left foot radiographs 05/26/2021 11/01/2020; MRI left forefoot 08/28/2020 MRI left hindfoot 05/29/2020 FINDINGS: Bones/Joint/Cartilage Postsurgical changes are again seen on prior posteroinferior calcaneus resection. There is increased erosion compared to 05/29/2020 within the posteroinferior aspect of the calcaneus. There is moderate diffuse marrow edema throughout the calcaneus greatest within the posterior plantar aspect radius findings are suspicious for active osteomyelitis. Moderate dorsal talonavicular degenerative osteophytosis. Severe navicular-cuneiform diffuse cartilage thinning and joint space narrowing. New moderate subchondral marrow edema within the proximal aspect of the lateral cuneiform. There are again small tibiotalar and posterior subtalar joint effusions. This again raises suspicion for septic arthritis. Ligaments The medial and lateral ankle ligaments are intact. The Lisfranc ligament complex is intact. Muscles and Tendons There is  moderate edema within the plantar foot musculature nonspecific myositis. There appears to be new mild connection of the distal Achilles tendon in the region of the prior full-thickness tear or surgical release. This may be secondary to scarring. Soft tissues There is diffuse edema and moderate soft tissue swelling throughout the posterior plantar hindfoot soft tissues consistent with the prior ulceration and postsurgical changes. There is scattered low signal with blooming artifact within the posterior plantar medial soft tissues. Some of the blooming artifact is compatible with metallic postsurgical change however this raises suspicion for air from a gas producing organism, especially as no definite metallic density is seen in this region on recent 05/26/2021 radiographs. There is decreased T1 and decreased T2 soft tissue signal in a somewhat bandlike configuration in the region of the prior complete soft tissue ulceration previously completely contact of the posteroinferior calcaneus on 05/29/2020 prior MRI. This is compatible with scarring in the region of the prior sinus tract/surgical wound dehiscence, however the edema and air suggests infectious cellulitis that contacts the posteroinferior plantar   plantar aspect of the remaining calcaneus. There are also small areas of fluid signal seen plantar to the remaining hindfoot flexor digitorum brevis musculature and deep to the abductor hallucis musculature. Otherwise no large abscess is visualized. IMPRESSION:: IMPRESSION: 1. Status post partial resection of the posteroinferior aspect of the calcaneus for osteomyelitis. Increased erosion of the posteroinferior aspect of the calcaneus compared to 05/29/2020 prior MRI. There is also extensive marrow edema throughout the calcaneus. Findings are concerning for active/acute osteomyelitis. 2. Some scarring in the prior of the ulceration or surgical wound dehiscence at the posterior plantar aspect of the left heel. Surrounding  likely infectious cellulitis with edema swelling and artifact suspicious for a gas producing organism. 3. Small tibiotalar and posterior subtalar joint effusions again concerning for septic arthritis. 4. Interval scarring of the prior full-thickness distal Achilles tendon tear with mild new tendon continuity. 5. Diffuse edema is again seen throughout the intrinsic foot musculature compatible with nonspecific myositis. Electronically Signed   By: Yvonne Kendall M.D.   On: 05/27/2021 08:33   VAS [place] ABI WITH/WO TBI  Result Date: 05/27/2021  LOWER EXTREMITY DOPPLER STUDY Patient Name:  CHAD DONOGHUE  Date of Exam:   05/27/2021 Medical Rec #: [id number]     Accession #:    [id number] Date of Birth: 09/12/1970     Patient Gender: M Patient Age:   41 years Exam Location:  [facility] Procedure:      VAS [place] ABI WITH/WO TBI Referring Phys: Jule Ser --------------------------------------------------------------------------------  Indications: Ulceration. High Risk Factors: Hypertension, Diabetes, past history of smoking.  Comparison Study: Patient states previous exam performed at outside facility                   [facility] unable to see results. Performing Technologist: Rogelia Rohrer RVT, RDMS  Examination Guidelines: A complete evaluation includes at minimum, Doppler waveform signals and systolic blood pressure reading at the level of bilateral brachial, anterior tibial, and posterior tibial arteries, when vessel segments are accessible. Bilateral testing is considered an integral part of a complete examination. Photoelectric Plethysmograph (PPG) waveforms and toe systolic pressure readings are included as required and additional duplex testing as needed. Limited examinations for reoccurring indications may be performed as noted.  ABI Findings: +---------+------------------+-----+---------+--------+  Right     Rt Pressure (mmHg) Index Waveform  Comment    +---------+------------------+-----+---------+--------+  Brachial                           triphasic         +---------+------------------+-----+---------+--------+  PTA       160                1.13  triphasic           +---------+------------------+-----+---------+--------+  DP        143                1.01  biphasic            +---------+------------------+-----+---------+--------+  Great Toe 124                0.88  Normal              +---------+------------------+-----+---------+--------+ +---------+------------------+-----+---------+-------+  Left      Lt Pressure (mmHg) Index Waveform  Comment  +---------+------------------+-----+---------+-------+  Brachial  141  triphasic          +---------+------------------+-----+---------+-------+  PTA       147                1.04  triphasic          +---------+------------------+-----+---------+-------+  DP        154                1.09  triphasic          +---------+------------------+-----+---------+-------+  Great Toe 130                0.92  Normal             +---------+------------------+-----+---------+-------+ +-------+-----------+-----------+------------+------------+  ABI/TBI Today's ABI Today's TBI Previous ABI Previous TBI  +-------+-----------+-----------+------------+------------+  Right   1.13        0.88                                   +-------+-----------+-----------+------------+------------+  Left    1.09        0.92                                   +-------+-----------+-----------+------------+------------+    PT did not want Pressure taken on RUE due to recent injury.  Summary: Right: Resting right ankle-brachial index is within normal range. No evidence of significant right lower extremity arterial disease. The right toe-brachial index is normal. Left: Resting left ankle-brachial index is within normal range. No evidence of significant left lower extremity arterial disease. The left toe-brachial index is normal.  *See  table(s) above for measurements and observations.  Electronically signed by Christopher Clark MD on 05/27/2021 at 6:55:42 PM.    Final   ° °ECHOCARDIOGRAM COMPLETE ° °Result Date: 05/27/2021 °   ECHOCARDIOGRAM REPORT   Patient Name:   Gaige Noxon Date of Exam: 05/27/2021 Medical Rec #:  [id number]    Height:       75.0 in Accession #:    [id number]   Weight:       167.5 lb Date of Birth:  03/21/1971    BSA:          2.035 m² Patient Age:    50 years     BP:           142/78 mmHg Patient Gender: M            HR:           79 bpm. Exam Location:  Inpatient Procedure: 2D Echo, Cardiac Doppler and Color Doppler Indications:    Bacteremia R78.81  History:        Patient has no prior history of Echocardiogram examinations.                 Risk Factors:Diabetes.  Sonographer:    Sarah Pirrotta RDCS Referring Phys: [id number] ANDREW N WALLACE IMPRESSIONS  1. Left ventricular ejection fraction, by estimation, is 55 to 60%. The left ventricle has normal function. The left ventricle has no regional wall motion abnormalities. Left ventricular diastolic function could not be evaluated.  2. Right ventricular systolic function is normal. The right ventricular size is normal.  3. The mitral valve is normal in structure. No evidence of mitral valve regurgitation. No evidence of mitral stenosis.  4. The aortic valve is normal in structure. Aortic valve   regurgitation is not visualized. No aortic stenosis is present.  5. Evidence of atrial level shunting detected by color flow Doppler. FINDINGS  Left Ventricle: Left ventricular ejection fraction, by estimation, is 55 to 60%. The left ventricle has normal function. The left ventricle has no regional wall motion abnormalities. The left ventricular internal cavity size was normal in size. There is  no left ventricular hypertrophy. Left ventricular diastolic function could not be evaluated. Right Ventricle: The right ventricular size is normal. Right vetricular wall thickness was not well  visualized. Right ventricular systolic function is normal. Left Atrium: Left atrial size was normal in size. Right Atrium: Right atrial size was normal in size. Pericardium: There is no evidence of pericardial effusion. Mitral Valve: The mitral valve is normal in structure. No evidence of mitral valve regurgitation. No evidence of mitral valve stenosis. There is no evidence of mitral valve vegetation. Tricuspid Valve: The tricuspid valve is normal in structure. Tricuspid valve regurgitation is trivial. Aortic Valve: The aortic valve is normal in structure. Aortic valve regurgitation is not visualized. No aortic stenosis is present. Pulmonic Valve: The pulmonic valve was normal in structure. Pulmonic valve regurgitation is not visualized. Aorta: The aortic root and ascending aorta are structurally normal, with no evidence of dilitation. IAS/Shunts: Evidence of atrial level shunting detected by color flow Doppler.  LEFT VENTRICLE PLAX 2D LVIDd:         5.00 cm LVIDs:         3.70 cm LV PW:         1.00 cm LV IVS:        0.90 cm LVOT diam:     2.10 cm LV SV:         57 LV SV Index:   28 LVOT Area:     3.46 cm  LV Volumes (MOD) LV vol d, MOD A2C: 130.0 ml LV vol d, MOD A4C: 97.7 ml LV vol s, MOD A2C: 57.8 ml LV vol s, MOD A4C: 44.7 ml LV SV MOD A2C:     72.2 ml LV SV MOD A4C:     97.7 ml LV SV MOD BP:      66.3 ml RIGHT VENTRICLE RV S prime:     11.00 cm/s TAPSE (M-mode): 2.0 cm LEFT ATRIUM             Index        RIGHT ATRIUM           Index LA diam:        3.10 cm 1.52 cm/m   RA Area:     14.20 cm LA Vol (A2C):   32.1 ml 15.77 ml/m  RA Volume:   36.60 ml  17.98 ml/m LA Vol (A4C):   33.2 ml 16.31 ml/m LA Biplane Vol: 33.3 ml 16.36 ml/m  AORTIC VALVE LVOT Vmax:   90.80 cm/s LVOT Vmean:  56.100 cm/s LVOT VTI:    0.166 m  AORTA Ao Root diam: 3.00 cm MITRAL VALVE MV Area (PHT): 4.17 cm    SHUNTS MV Decel Time: 182 msec    Systemic VTI:  0.17 m MV E velocity: 67.70 cm/s  Systemic Diam: 2.10 cm MV A velocity: 61.30  cm/s MV E/A ratio:  1.10 Mertie Moores MD Electronically signed by Mertie Moores MD Signature Date/Time: 05/27/2021/3:14:59 PM    Final      Labs:   Basic Metabolic Panel: Recent Labs  Lab 05/26/21 1849 05/27/21 0232 05/28/21 0316 05/29/21 0433 05/30/21 0353  NA 130*  133* 136 134* 134*  K 4.3 4.2 3.8 3.8 3.9  CL 93* 96* 100 97* 95*  CO2 _0 32  GLUCOSE 394* 324* 207* 164* 107*  BUN _1 CREATININE 0.85 0.89 0.60* 0.61 0.63  CALCIUM 8.5* 8.5* 8.7* 8.5* 8.8*   GFR Estimated Creatinine Clearance: 116.1 mL/min (by C-G formula based on SCr of 0.63 mg/dL). Liver Function Tests: Recent Labs  Lab 05/26/21 1849  AST 20  ALT 17  ALKPHOS 110  BILITOT 0.6  PROT 6.9  ALBUMIN 2.4*   No results for input(s): LIPASE, AMYLASE in the last 168 hours. No results for input(s): AMMONIA in the last 168 hours. Coagulation profile Recent Labs  Lab 05/26/21 1849  INR 1.2    CBC: Recent Labs  Lab 05/27/21 0232 05/28/21 0316 05/29/21 0433 05/30/21 0353  WBC 10.7* 8.5 9.3 9.4  HGB 11.5* 12.2* 11.1* 11.2*  HCT 34.4* 35.8* 33.3* 33.3*  MCV 88.4 88.8 88.6 89.3  PLT 400 454* 490* 478*   Cardiac Enzymes: No results for input(s): CKTOTAL, CKMB, CKMBINDEX, TROPONINI in the last 168 hours. BNP: Invalid input(s): POCBNP CBG: Recent Labs  Lab 05/29/21 0749 05/29/21 1134 05/29/21 1623 05/29/21 1936 05/30/21 0639  GLUCAP 155* 257* 192* 258* 102*   D-Dimer No results for input(s): DDIMER in the last 72 hours. Hgb A1c No results for input(s): HGBA1C in the last 72 hours. Lipid Profile No results for input(s): CHOL, HDL, LDLCALC, TRIG, CHOLHDL, LDLDIRECT in the last 72 hours. Thyroid function studies No results for input(s): TSH, T4TOTAL, T3FREE, THYROIDAB in the last 72 hours.  Invalid input(s): FREET3 Anemia work up No results for input(s): VITAMINB12, FOLATE, FERRITIN, TIBC, IRON, RETICCTPCT in the last 72 hours. Microbiology Recent Results (from the past  240 hour(s))  Culture, blood (routine x 2)     Status: None (Preliminary result)   Collection Time: 05/26/21  6:49 PM   Specimen: BLOOD LEFT HAND  Result Value Ref Range Status   Specimen Description BLOOD LEFT HAND  Final   Special Requests   Final    BOTTLES DRAWN AEROBIC AND ANAEROBIC Blood Culture results may not be optimal due to an inadequate volume of blood received in culture bottles   Culture   Final    NO GROWTH 4 DAYS Performed at [facility], [place] [address], [place], [place] [address]    Report Status PENDING  Incomplete  Culture, blood (routine x 2)     Status: None (Preliminary result)   Collection Time: 05/26/21  6:52 PM   Specimen: BLOOD LEFT HAND  Result Value Ref Range Status   Specimen Description BLOOD LEFT HAND  Final   Special Requests   Final    BOTTLES DRAWN AEROBIC AND ANAEROBIC Blood Culture results may not be optimal due to an inadequate volume of blood received in culture bottles   Culture   Final    NO GROWTH 4 DAYS Performed at [facility], [place] [address], [place], [place] [address]    Report Status PENDING  Incomplete  Surgical PCR screen     Status: Abnormal   Collection Time: 05/28/21  7:02 AM   Specimen: Nasal Mucosa; Nasal Swab  Result Value Ref Range Status   MRSA, PCR NEGATIVE NEGATIVE Final   Staphylococcus aureus POSITIVE (A) NEGATIVE Final    Comment: (NOTE) The Xpert SA Assay (FDA approved for NASAL specimens in patients 57 years of age and older), is one component  of a comprehensive surveillance program. It is not intended to diagnose infection nor to guide or monitor treatment. Performed at [facility], [place] [address], [place], [place] [address]   Resp Panel by RT-PCR (Flu A&B, Covid) Nasopharyngeal Swab     Status: None   Collection Time: 05/28/21  8:02 AM   Specimen: Nasopharyngeal Swab; Nasopharyngeal(NP) swabs in vial transport medium  Result Value Ref Range Status   SARS Coronavirus 2 by RT PCR NEGATIVE  NEGATIVE Final    Comment: (NOTE) SARS-CoV-2 target nucleic acids are NOT DETECTED.  The SARS-CoV-2 RNA is generally detectable in upper respiratory specimens during the acute phase of infection. The lowest concentration of SARS-CoV-2 viral copies this assay can detect is 138 copies/mL. A negative result does not preclude SARS-Cov-2 infection and should not be used as the sole basis for treatment or other patient management decisions. A negative result may occur with  improper specimen collection/handling, submission of specimen other than nasopharyngeal swab, presence of viral mutation(s) within the areas targeted by this assay, and inadequate number of viral copies(<138 copies/mL). A negative result must be combined with clinical observations, patient history, and epidemiological information. The expected result is Negative.  Fact Sheet for Patients:  EntrepreneurPulse.com.au  Fact Sheet for Healthcare Providers:  IncredibleEmployment.be  This test is no t yet approved or cleared by the [place] FDA and  has been authorized for detection and/or diagnosis of SARS-CoV-2 by FDA under an Emergency Use Authorization (EUA). This EUA will remain  in effect (meaning this test can be used) for the duration of the COVID-19 declaration under Section 564(b)(1) of the Act, 21 U.S.C.section 360bbb-3(b)(1), unless the authorization is terminated  or revoked sooner.       Influenza A by PCR NEGATIVE NEGATIVE Final   Influenza B by PCR NEGATIVE NEGATIVE Final    Comment: (NOTE) The Xpert Xpress SARS-CoV-2/FLU/RSV plus assay is intended as an aid in the diagnosis of influenza from Nasopharyngeal swab specimens and should not be used as a sole basis for treatment. Nasal washings and aspirates are unacceptable for Xpert Xpress SARS-CoV-2/FLU/RSV testing.  Fact Sheet for Patients: EntrepreneurPulse.com.au  Fact Sheet for Healthcare  Providers: IncredibleEmployment.be  This test is not yet approved or cleared by the [place] FDA and has been authorized for detection and/or diagnosis of SARS-CoV-2 by FDA under an Emergency Use Authorization (EUA). This EUA will remain in effect (meaning this test can be used) for the duration of the COVID-19 declaration under Section 564(b)(1) of the Act, 21 U.S.C. section 360bbb-3(b)(1), unless the authorization is terminated or revoked.  Performed at [facility], [place] [address] [place], [place] [address]     Time coordinating discharge: 35 minutes  Signed: Emmily Pellegrin  [facility] 05/30/2021, 10:18 AM

## 2021-05-30 NOTE — Evaluation (Signed)
Occupational Therapy Evaluation Patient Details Name: [name] MRN: [id number] DOB: 1970/09/23 Today's Date: 05/30/2021   History of Present Illness Pt is a 51 y.o. male admitted 05/26/21 with hyperglycemia, also found to have L calcaneal abscess. 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 TEE recommended, pt declined. Pt also with R shoulder pain, imaging at [facility] negative for acute injury per pt. PMH includes DM2, HTN.   Clinical Impression   Pt was functioning independently prior to admission. Presents with mild post operative pain and impaired standing balance, requiring RW and min guard assist for mobility. Educated pt in compensatory strategies for LB ADLs with pt verbalizing understanding and demonstrating ability to don LB clothing at bed level, rolling side to side. Will follow acutely.      Recommendations for follow up therapy are one component of a multi-disciplinary discharge planning process, led by the attending physician.  Recommendations may be updated based on patient status, additional functional criteria and insurance authorization.   Follow Up Recommendations  No OT follow up    Assistance Recommended at Discharge Intermittent Supervision/Assistance  Patient can return home with the following Help with stairs or ramp for entrance;Assist for transportation;A little help with bathing/dressing/bathroom    Functional Status Assessment  Patient has had a recent decline in their functional status and demonstrates the ability to make significant improvements in function in a reasonable and predictable amount of time.  Equipment Recommendations  BSC/3in1;Tub/shower bench    Recommendations for Other Services       Precautions / Restrictions Precautions Precautions: Fall Precaution Comments: L residual limb wound vac Required Braces or Orthoses: Other Brace Other Brace: L BKA limb  guard Restrictions Weight Bearing Restrictions: Yes LLE Weight Bearing: Non weight bearing      Mobility Bed Mobility Overal bed mobility: Modified Independent             General bed mobility comments: returned to supine    Transfers Overall transfer level: Needs assistance Equipment used: Rolling walker (2 wheels) Transfers: Sit to/from Stand Sit to Stand: Min guard           General transfer comment: pt self cued hand placement      Balance Overall balance assessment: Needs assistance   Sitting balance-Leahy Scale: Good     Standing balance support: Reliant on assistive device for balance Standing balance-Leahy Scale: Poor                             ADL either performed or assessed with clinical judgement   ADL Overall ADL's : Needs assistance/impaired Eating/Feeding: Independent;Sitting   Grooming: Wash/dry hands;Wash/dry face;Sitting;Set up   Upper Body Bathing: Set up;Sitting   Lower Body Bathing: Set up;Sitting/lateral leans   Upper Body Dressing : Set up;Sitting   Lower Body Dressing: Set up;Bed level   Toilet Transfer: Min guard;Ambulation;BSC/3in1;Rolling walker (2 wheels)   Toileting- Clothing Manipulation and Hygiene: Set up;Sitting/lateral lean       Functional mobility during ADLs: Min guard;Rolling walker (2 wheels)       Vision Baseline Vision/History: 0 No visual deficits Ability to See in Adequate Light: 0 Adequate       Perception     Praxis      Pertinent Vitals/Pain Pain Assessment Pain Assessment: Faces Faces Pain Scale: Hurts a little bit Pain Location: L residual limb Pain Descriptors / Indicators: Throbbing, Burning Pain Intervention(s): Monitored during  session, Premedicated before session, Repositioned     Hand Dominance Right   Extremity/Trunk Assessment Upper Extremity Assessment Upper Extremity Assessment: RUE deficits/detail RUE Deficits / Details: long standing shoulder limitations    Lower Extremity Assessment Lower Extremity Assessment: Defer to PT evaluation LLE Deficits / Details: s/p L BKA; hip functionally >3/5 strength, knee at least 3/5 though lacking full flexion with expected post-op pain   Cervical / Trunk Assessment Cervical / Trunk Assessment: Normal   Communication Communication Communication: No difficulties   Cognition Arousal/Alertness: Awake/alert Behavior During Therapy: WFL for tasks assessed/performed, Restless Overall Cognitive Status: Within Functional Limits for tasks assessed                                 General Comments: pt now stating he would like to stay another night     General Comments      Exercises     Shoulder Instructions      Home Living Family/patient expects to be discharged to:: Private residence Living Arrangements: Spouse/significant other Available Help at Discharge: Family;Available 24 hours/day Type of Home: Mobile home Home Access: Stairs to enter Entrance Stairs-Number of Steps: 4 Entrance Stairs-Rails: Right;Left Home Layout: One level     Bathroom Shower/Tub: Chief Strategy Officer: Standard Bathroom Accessibility: Yes   Home Equipment: Cane - single point;Crutches   Additional Comments: Reports he lives with ex-wife who can be available for 24/7 assist      Prior Functioning/Environment Prior Level of Function : Independent/Modified Independent             Mobility Comments: Typically indep without DME, drives          OT Problem List: Impaired balance (sitting and/or standing);Decreased knowledge of use of DME or AE;Pain      OT Treatment/Interventions: Self-care/ADL training;DME and/or AE instruction;Therapeutic activities;Patient/family education;Balance training    OT Goals(Current goals can be found in the care plan section) Acute Rehab OT Goals OT Goal Formulation: With patient Time For Goal Achievement: 06/13/21 Potential to Achieve Goals:  Good ADL Goals Pt Will Perform Grooming: with modified independence;sitting;standing Pt Will Perform Lower Body Bathing: with modified independence;sitting/lateral leans Pt Will Perform Lower Body Dressing: with modified independence;sit to/from stand Pt Will Transfer to Toilet: with modified independence;ambulating;bedside commode (over toilet) Pt Will Perform Toileting - Clothing Manipulation and hygiene: with modified independence;sitting/lateral leans Additional ADL Goal #1: Pt will be knowledgeable in use of tub transfer bench when cleared to shower.  OT Frequency: Min 2X/week    Co-evaluation              AM-PAC OT "6 Clicks" Francis Activity     Outcome Measure Help from another person eating meals?: None Help from another person taking care of personal grooming?: A Little Help from another person toileting, which includes using toliet, bedpan, or urinal?: A Little Help from another person bathing (including washing, rinsing, drying)?: A Little Help from another person to put on and taking off regular upper body clothing?: None Help from another person to put on and taking off regular lower body clothing?: A Little 6 Click Score: 20   End of Session Equipment Utilized During Treatment: Rolling walker (2 wheels)  Activity Tolerance: Patient tolerated treatment well Patient left: in bed;with call bell/phone within reach  OT Visit Diagnosis: Unsteadiness on feet (R26.81);Other abnormalities of gait and mobility (R26.89);Pain  Time: 1001-1040 OT Time Calculation (min): 39 min Charges:  OT General Charges $OT Visit: 1 Visit OT Evaluation $OT Eval Low Complexity: 1 Low OT Treatments $Self Care/Home Management : 23-37 mins  Martie Round, OTR/L Acute Rehabilitation Services Pager: [phone] Office: [phone]  Evern Bio 05/30/2021, 10:53 AM

## 2021-05-30 NOTE — Progress Notes (Signed)
Inpatient Diabetes Program Recommendations  AACE/ADA: New Consensus Statement on Inpatient Glycemic Control (2015)  Target Ranges:  Prepandial:   less than 140 mg/dL      Peak postprandial:   less than 180 mg/dL (1-2 hours)      Critically ill patients:  140 - 180 mg/dL   Lab Results  Component Value Date   GLUCAP 102 (H) 05/30/2021   HGBA1C 9.0 (H) 05/26/2021    Review of Glycemic Control  Latest Reference Range & Units 05/28/21 08:01 05/28/21 10:13 05/28/21 11:56 05/28/21 16:06 05/28/21 19:58 05/29/21 07:49  Glucose-Capillary 70 - 99 mg/dL 616 (H)  Novolog 7 units 166 (H) 187 (H)  Novolog 4 units 226 (H)  Novolog 7 units 284 (H)  Novolog 3 units 155 (H)  Novolog 4 units    Latest Reference Range & Units 05/29/21 07:49 05/29/21 11:34 05/29/21 16:23 05/29/21 19:36 05/30/21 06:39  Glucose-Capillary 70 - 99 mg/dL 073 (H) 710 (H) 626 (H) 258 (H) 102 (H)   Diabetes history: DM 2 Outpatient Diabetes medications: Levemir 28 units, Humalog 0-15 units Current orders for Inpatient glycemic control:  Levemir 20 units qhs Novolog 0-20 units tid + hs  A1c 9% on 2/19  Inpatient Diabetes Program Recommendations:   Glucose trends lower.  -  consider Novolog 3 units tid meal coverage  Thanks,  Christena Deem RN, MSN, BC-ADM Inpatient Diabetes Coordinator Team Pager [phone] (8a-5p)

## 2021-05-30 NOTE — TOC Transition Note (Signed)
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 RN, patient, and patient's family notified of DC. Pt will require LT IV ABX therapy. Home health orders noted. Pt agreeable to home health services. Pt without  preference. Referral made with [facility] and [facility] and accepted, [facility] 05/31/2021. Pam with [facility] to provide teaching to pt @ bedside prior to d/c. PICC line inplacement. Pt without Rx med concerns. Post hospital f/u noted on AVS. Wife to provide transportation to home.  RNCM will sign off for now as intervention is no longer needed. Please consult [place] again if new needs arise.    Final next level of care: Home w Home Health Services Barriers to Discharge: No Barriers Identified   Patient Goals and CMS Choice     Choice offered to / list presented to : Patient  Discharge Placement                       Discharge Plan and Services   Discharge Planning Services: CM Consult            DME Arranged: Other see comment ([facility]/ IV ABX therapy) DME Agency: Other - Comment ([facility]) Date DME Agency Contacted: 05/30/21 Time DME Agency Contacted: 1202 Representative spoke with at DME Agency: Pam HH Arranged: RN HH Agency: Other - See comment Human resources officer Home Health) Date HH Agency Contacted: 05/30/21 Time HH Agency Contacted: 1202 Representative spoke with at [facility] Agency: Pam  Social Determinants of Health (SDOH) Interventions     Readmission Risk Interventions No flowsheet data  found.

## 2021-05-31 LAB — CULTURE, BLOOD (ROUTINE X 2)
Culture: NO GROWTH
Culture: NO GROWTH

## 2021-06-03 ENCOUNTER — Telehealth: Payer: Self-pay | Admitting: Orthopedic Surgery

## 2021-06-03 NOTE — Telephone Encounter (Signed)
Patient is calling in regards to her husband who had surgery. He is experiencing constipation due to the pain meds and the prescribed laxative does not seem to be working.

## 2021-06-03 NOTE — Telephone Encounter (Signed)
Pts wife informed.

## 2021-06-03 NOTE — Telephone Encounter (Signed)
Taking senakot 8.6mg  at bedtime.

## 2021-06-04 ENCOUNTER — Telehealth: Payer: Self-pay

## 2021-06-04 NOTE — Telephone Encounter (Signed)
Patient's wife called stating that patient has not gotten out of bed since being home from surgery, which was on 05/28/2021 for left BKA.  Would like to know if this is normal?  CB# [phone].  Please advise.  Thank you.

## 2021-06-05 ENCOUNTER — Telehealth: Payer: Self-pay | Admitting: Orthopedic Surgery

## 2021-06-05 NOTE — Telephone Encounter (Signed)
Pt informed protein shake needs to have 30 grams of protein. Anyone he likes is fine. ?

## 2021-06-05 NOTE — Telephone Encounter (Signed)
Pt called and is wondering what kind of protein shake he is suppose to be drinking?  ? ?CB [phone] ?

## 2021-06-05 NOTE — Telephone Encounter (Signed)
SW pt's wife, gave her info on healing process and what to expect. Pt has been more tired after taking oxycodone and sleeps. I advised he is allowed to move around to however he is comfortable. If they have a wheelchair or crutches he can ambulate that way if he feels stable. Wear his limb protector whenever moving around incase of falls. She will make sure his leg stays elevated when he is sitting or lying down. They will be here for his post op apt on Friday. ?

## 2021-06-07 ENCOUNTER — Inpatient Hospital Stay: Payer: Medicaid Other | Admitting: Family

## 2021-06-07 ENCOUNTER — Telehealth: Payer: Self-pay | Admitting: Family

## 2021-06-07 DIAGNOSIS — Z89512 Acquired absence of left leg below knee: Secondary | ICD-10-CM

## 2021-06-07 MED ORDER — OXYCODONE-ACETAMINOPHEN 5-325 MG PO TABS: 1.0000 | ORAL_TABLET | ORAL | 0 refills | Status: AC | PRN

## 2021-06-07 NOTE — Telephone Encounter (Signed)
Pt had an apt this morning with you for post op, they moved it to 06/11/21. He is s/p left BKA on 05/28/21 ?

## 2021-06-07 NOTE — Telephone Encounter (Signed)
Patient's wife [name] called advised patient is out of his pain medication. The number to contact [name] is [phone] ?

## 2021-06-10 ENCOUNTER — Telehealth: Payer: Self-pay

## 2021-06-10 NOTE — Telephone Encounter (Signed)
Patient HH nurse Fleet Contras called stating she was unable to flush patient's picc line or get blood from the picc line. I attempted to call Fleet Contras to follow up and number was incorrect.  ? ?I spoke to the patient and patient stated that the nurse did a peripheral stick to get his blood today. Patient reports picc line is functioning fine now and he was able to do his second dose this afternoon with no issues. Patient will call in the morning to let us know how his third dose goes later on tonight and let us know if his picc line is functioning properly.  ?

## 2021-06-11 ENCOUNTER — Ambulatory Visit (INDEPENDENT_AMBULATORY_CARE_PROVIDER_SITE_OTHER): Payer: Medicaid Other | Admitting: Family

## 2021-06-11 ENCOUNTER — Encounter: Payer: Self-pay | Admitting: Family

## 2021-06-11 DIAGNOSIS — Z89512 Acquired absence of left leg below knee: Secondary | ICD-10-CM

## 2021-06-11 NOTE — Telephone Encounter (Signed)
I spoke to patient this morning and patient stated picc line is till working properly. Patient will follow up with our office tomorrow.  ?[name] T Eliodoro Gullett ? ?

## 2021-06-11 NOTE — Progress Notes (Signed)
? ?  Post-Op Visit Note ?  ?Patient: [name]           ?Date of Birth: 1971-01-27           ?MRN: [id number] ?Visit Date: 06/11/2021 ?PCP: [place], Sarah T, MD ? ?Chief Complaint:  ?Chief Complaint  ?Patient presents with  ? Left Leg - Routine Post Op  ?  05/28/2021 left BKA  ?D/c vac no drainage, wearing shrinker and limb protector ?Photo obtained.   ? ? 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  Wound VAC removed today. ?Ortho Exam ?On examination of the left residual limb this is well consolidated healing well staples are in place there is no active drainage no erythema ? ?Visit Diagnoses: No diagnosis found. ? ?Plan: Begin daily Dial soap cleansing dry dressing changes given in extra-large shrinker today.  Also given an order for his prosthesis set up he will follow-up in 2 weeks for staple removal ? ?Follow-Up Instructions: Return in about 2 weeks (around 06/25/2021).  ? ?Imaging: ?No results found. ? ?Orders:  ?No orders of the defined types were placed in this encounter. ? ?No orders of the defined types were placed in this encounter. ? ? ? ?PMFS History: ?Patient Active Problem List  ? Diagnosis Date Noted  ? Sepsis due to methicillin susceptible Staphylococcus aureus (HCC) 05/27/2021  ? Acute osteomyelitis of left calcaneus (HCC)   ? Right shoulder pain 05/26/2021  ? Essential hypertension 05/26/2021  ? MSSA bacteremia 05/26/2021  ? Osteomyelitis (HCC) 10/20/2020  ? 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 Pernicious anemia 06/11/2020  ? Moderate protein-calorie malnutrition (HCC) 05/28/2020  ? Osteomyelitis of second toe of left foot (HCC) 05/28/2020  ? Cellulitis of left foot 10/19/2019  ? Hyponatremia 10/19/2019  ? 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 Ulcer of foot due to diabetes (HCC) 10/19/2019  ? Insulin-requiring or dependent type II diabetes mellitus (HCC) 06/21/2019  ? ?Past Medical History:  ?Diagnosis  Date  ? Diabetes mellitus without complication (HCC)   ? Hypertension   ?  ?Family History  ?Problem Relation Age of Onset  ? Diabetes Mellitus II Sister   ?  ?Past Surgical History:  ?Procedure Laterality Date  ? AMPUTATION Left 05/28/2021  ? Procedure: LEFT BELOW KNEE AMPUTATION;  Surgeon: Nadara Mustard, MD;  Location[facility] OR;  Service: Orthopedics;  Laterality: Left;  ? AMPUTATION TOE Left 10/21/2020  ? Procedure: LEFT SECOND TOE AMPUTATION;  Surgeon: Park Liter, DPM;  Location: [facility] ORS;  Service: Podiatry;  Laterality: Left;  ? mastoid surgery    ? ?Social History  ? ?Occupational History  ? Not on file  ?Tobacco Use  ? Smoking status: Former  ? Smokeless tobacco: Never  ?Substance and Sexual Activity  ? Alcohol use: No  ? Drug use: No  ? Sexual activity: Not on file  ? ? ?

## 2021-06-12 ENCOUNTER — Ambulatory Visit (INDEPENDENT_AMBULATORY_CARE_PROVIDER_SITE_OTHER): Payer: Medicaid Other | Admitting: Internal Medicine

## 2021-06-12 ENCOUNTER — Ambulatory Visit: Payer: Medicaid Other | Admitting: Internal Medicine

## 2021-06-12 ENCOUNTER — Other Ambulatory Visit: Payer: Self-pay

## 2021-06-12 ENCOUNTER — Telehealth: Payer: Self-pay

## 2021-06-12 ENCOUNTER — Encounter: Payer: Self-pay | Admitting: Internal Medicine

## 2021-06-12 VITALS — BP 147/78 | HR 86 | Temp 98.3°F | Ht 75.0 in | Wt 180.0 lb

## 2021-06-12 DIAGNOSIS — R7881 Bacteremia: Secondary | ICD-10-CM

## 2021-06-12 DIAGNOSIS — E11628 Type 2 diabetes mellitus with other skin complications: Secondary | ICD-10-CM

## 2021-06-12 DIAGNOSIS — L089 Local infection of the skin and subcutaneous tissue, unspecified: Secondary | ICD-10-CM

## 2021-06-12 DIAGNOSIS — M869 Osteomyelitis, unspecified: Secondary | ICD-10-CM | POA: Diagnosis not present

## 2021-06-12 DIAGNOSIS — B9561 Methicillin susceptible Staphylococcus aureus infection as the cause of diseases classified elsewhere: Secondary | ICD-10-CM

## 2021-06-12 NOTE — Telephone Encounter (Signed)
Called [facility] and spoke with pharmacist Amy. Communicated verbal orders from Dr. Renold Don to obtain CBC, CMP, and CRP weekly for this patient. Pharmacist acknowledged orders.  ? ?Wyvonne Lenz, RN  ?

## 2021-06-12 NOTE — Telephone Encounter (Signed)
Called and lm on vm to advise pt to call back to discuss concerns about incision.  ?

## 2021-06-12 NOTE — Telephone Encounter (Signed)
Patient called stating that when he took his bandage off, stated that his incision feels like gel and wanted to know if this was normal?  CB# [phone].  Please advise.  Thank you. ?

## 2021-06-12 NOTE — Patient Instructions (Signed)
Continue wound care as you are now ? ? ?You are currently getting iv antibiotics for blood stream infection. The left leg is free of infection now ? ? ?Follow up with orthopedics for further instruction on leg care ? ? ?See me in around 4 weeks ?

## 2021-06-12 NOTE — Telephone Encounter (Signed)
Thank you, Erin

## 2021-06-12 NOTE — Progress Notes (Signed)
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 I last saw him 06/2020, when he was started on oral abx for another 6 weeks for left heel ulcer/calcaneal om (prior cx mssa, proteus vulgaris and mirabilis).  He did ok but heel never healed. He was admitted to [facility] in transfer from [facility] for sepsis after a few days left foot pain/dischargein setting left heel mri imaging abscess/OM complicated by mssa bacteremia  He also has been followed by dr March Rummage of podiatry outpatient  He underwent bka by dr Sharol Given on 2/21 He refused tee Tte was negative for obvious sign of endocarditis 2/19 bcx at [facility] cone was negative At [place] prior to transfer his blood cx was positive  ID dr Juleen China consulted and recommended 6 weeks of iv cefazolin due to the fact the he refused tee. There was no  concern for other focus of metastatic infection  He done well/tolerate abx and picc as of 06/12/2021 clinic visit today   From previous note by me: --------------- 07/03/20 id f/u Patient was continued to ceftriaxone/flagyl Reviewed microbiology [facility]. There was proteus vulgaris which is amp-c organism He has no other lab tests on iv abx [facility] [place] clinic is where he gets iv antibiotics.  There is still discharge mainly bloody. Over all he thinks the foot wound is better, but the past few days the incision is getting more red And also the clinic where he gets his iv ceftriaxone had stopped giving him iv ceftriaxone 1 week before.  No f/c/n/v/diarrhea    Background from 06/15/20 id consult --------- More than 1 year progressive left heel ulcer Followed by podiatry Has had a few debridements Was on oral antibiotics off and on  Prior mri did show osteomyeltitis  He developed fever and progressive ulcer/pain/redness/pus discharge and was admitted 2/21 to [facility] so admitted. Underwent I&D  I reviewed progress note on epic. There is only patient AVS from the recent admission.   He showed me initial picture of the deep ulcer 2.5 inch  Recently started on iv abx ceftriaxone/metronidazole during 05/28/20 admission  Overall pain better. No more redness. Incision intact. Still has suture/staples  Nonsmoking No prior vascular evaluation  I don't have any of his labs today He has [facility] His rue picc site is not painful and not having issue with it  No n/v/diarrhea/rash  Review of Systems: ROS All other ros negative      Past Medical History:  Diagnosis Date   Diabetes mellitus without complication ([place])    Hypertension     Social History   Tobacco Use   Smoking status: Former   Smokeless tobacco: Never  Substance Use Topics   Alcohol use: No   Drug use: No    Family History  Problem Relation Age of Onset   Diabetes  Mellitus II Sister     Allergies  Allergen Reactions   Piperacillin Hives    Tolerated Cefepime and Cefazolin Feb 2023   Tazobactam Hives    OBJECTIVE: Vitals:   06/12/21 0937  BP: (!) 147/78  Pulse: 86  Temp: 98.3 F (36.8 C)  TempSrc: Oral  SpO2: 98%  Weight: 180 lb (81.6 kg)  Height: _0  (1.905 m)   Body mass index is 22.5 kg/m.  General/constitutional: no distress, pleasant; here with wife and in wheel chair HEENT: Normocephalic, PER, Conj Clear, EOMI, Oropharynx clear Neck supple CV: rrr no mrg Lungs: clear to auscultation, normal respiratory effort Abd: Soft, Nontender Ext: no edema Skin: No Rash Neuro: nonfocal MSK: left bka stump stamples intact; wound no dehiscence; no surrounding erythema/swelling/tenderness   Central line presence: picc site clean/dry; slight erythema at insertion site of picc without fluctuance/tenderness   3/08 left bka stump        Lab: 2/27 osh lab Cbc 7/12/609 (no eosinphilia); cr 0.6; no lft; no crp/esr   Lab Results  Component Value Date   WBC 9.4 05/30/2021   HGB 11.2 (L) 05/30/2021   HCT 33.3 (L) 05/30/2021   MCV 89.3 05/30/2021   PLT 478 (H) 65/68/1275   Last metabolic panel Lab Results  Component Value Date   GLUCOSE 107 (H) 05/30/2021   NA 134 (L) 05/30/2021   K 3.9 05/30/2021   CL 95 (L) 05/30/2021   CO2 32 05/30/2021   BUN 8 05/30/2021   CREATININE 0.63 05/30/2021   GFRNONAA >60 05/30/2021   CALCIUM 8.8 (L) 05/30/2021   PROT 6.9 05/26/2021   ALBUMIN 2.4 (L) 05/26/2021   BILITOT 0.6 05/26/2021   ALKPHOS 110 05/26/2021   AST 20 05/26/2021   ALT 17 05/26/2021   ANIONGAP 7 05/30/2021    Microbiology: 05/26/21 bcx negative  05/2020 heel cx mssa and proteus vulgaris (from [facility]) 11/2019 heel ulcer mixed skin flora, mssa (S tetra), proteus mirabilis (S ampicillin, levo)  Serology:  Imaging: 2/20 tte  1. Left ventricular ejection fraction, by estimation, is 55 to 60%. The  left ventricle  has normal function. The left ventricle has no regional  wall motion abnormalities. Left ventricular diastolic function could not  be evaluated.   2. Right ventricular systolic function is normal. The right ventricular  size is normal.   3. The mitral valve is normal in structure. No evidence of mitral valve  regurgitation. No evidence of mitral stenosis.   4. The aortic valve is normal in structure. Aortic valve regurgitation is  not visualized. No aortic stenosis is present.   5. Evidence of atrial level shunting detected by color flow Doppler.  2/19 mri left foot 1. Status post partial resection of the posteroinferior aspect of the calcaneus for osteomyelitis. Increased erosion of the posteroinferior aspect of the calcaneus compared to 05/29/2020 prior MRI. There is also extensive marrow edema throughout the calcaneus. Findings are concerning for active/acute osteomyelitis. 2. Some scarring in the prior of the ulceration or surgical wound dehiscence at the posterior  plantar aspect of the left heel. Surrounding likely infectious cellulitis with edema swelling and artifact suspicious for a gas producing organism. 3. Small tibiotalar and posterior subtalar joint effusions again concerning for septic arthritis. 4. Interval scarring of the prior full-thickness distal Achilles tendon tear with mild new tendon continuity. 5. Diffuse edema is again seen throughout the intrinsic foot musculature compatible with nonspecific myositis.  Assessment/plan: Problem List Items Addressed This Visit       Musculoskeletal and Integument   Osteomyelitis ([place])     Other   MSSA bacteremia - Primary   Other Visit Diagnoses     Diabetic foot infection ([place])           Abx: 05/28/21-c cefazolin  Patient 51 yo male with diabetes mellitus with recurrent/relapse left calcaneal OM/diabetic foot infection complicated by mssa bacteremia  S/p amputation bka lle on 2/21 Bcx positive 2/19, s/p abx,  and repeat same day negative 2/20 tte negative; refused tee  Planned IV cefazolin 6 weeks from 2/21 until 07/09/2021   06/12/21 id clinic assessment Tolerating abx well Picc site no issue Left bka stump no sign of dehiscence/infectious complication  Opat labs without lft or crp on 2/27   -finish 6 week cefazolin on 4/04 -advised hh to get weekly cbc/cmp/crp -f/u 4 weeks   I have spent a total of 30 minutes of face-to-face and non-face-to-face time, excluding clinical staff time, preparing to see patient, ordering tests and/or medications, and provide counseling the patient       Follow-up: Return in about 4 weeks (around 07/10/2021).  Jabier Mutton, [place] for [place] [phone] pager   [phone] cell 06/12/2021, 9:39 AM

## 2021-06-18 ENCOUNTER — Other Ambulatory Visit: Payer: Self-pay

## 2021-06-18 ENCOUNTER — Other Ambulatory Visit: Payer: Self-pay | Admitting: Pharmacist

## 2021-06-18 ENCOUNTER — Telehealth: Payer: Self-pay

## 2021-06-18 DIAGNOSIS — M86172 Other acute osteomyelitis, left ankle and foot: Secondary | ICD-10-CM

## 2021-06-18 MED ORDER — CEFADROXIL 500 MG PO CAPS
1000.0000 mg | ORAL_CAPSULE | Freq: Two times a day (BID) | ORAL | 0 refills | Status: AC
Start: 2021-06-18 — End: 2021-07-09

## 2021-06-18 NOTE — Telephone Encounter (Signed)
Thank you Cece!

## 2021-06-18 NOTE — Telephone Encounter (Addendum)
Returned vm call to nurse Apolonio Schneiders from [facility] [phone]) about pt picc not functioning. She states that the pt picc line is not flushing and the only way it will work is if the patient massages the area around the picc line and she also states that the picc line is out at about one centimeter. She states this happened last week the pt got it to flush once he bent his arm. Notified nurse we will call pt with f/u plan ?Adelfa Koh, CMA ? ?

## 2021-06-18 NOTE — Telephone Encounter (Signed)
Patient called back stating his local [facility] did not have the cefadroxil in stock. I reached out to Dr. Gale Journey and per Dr. Gale Journey patient will be ok to just restart IV antibiotics tomorrow once a new picc line is placed.  ? ?I advised the patient that he did not need to start the oral antibiotics per Dr. Gale Journey. Patient also advised to reach out to our office if he is unable to get new picc placed tomorrow. Rx for Cefeadroxil also canceled by Aniceto Boss, RN ?Tiffany Talarico T Kiriana Worthington ? ?

## 2021-06-18 NOTE — Telephone Encounter (Signed)
Patient's wife called office today stating that picc line has come out a little bit. Is not able to infuse antibiotics. States HH was not able to get blood yesterday. Advised that patient go to ED today for picc evaluation/ new picc.  ?Per patient's wife he is "bed bound". Has only been out of bed for his appointment with ortho and [facility]. Is concerned he might "fall and bleed out."  ?Spoke with Cassie, Pharmacist who is also recommending pt go to ED. ? ?Patient's wife requested cma call HH for evaluation before going to ED. Spoke with Larita Fife, Pharmacist who will have nursing see patient later today. ? ?Juanita Laster, RMA ? ?

## 2021-06-18 NOTE — Telephone Encounter (Signed)
Called and spoke to pt and ex wife whom brings him to his appointments to confirm that they will be at his appt with IR @ 1pm on 06/19/21 and they verbalized understanding. I also informed the pt that Dr. Gale Journey sent in an oral abx Cefadroxil to take until he can start back his IV abx. Pt verbalized understanding. ?Adelfa Koh, CMA ? ?

## 2021-06-18 NOTE — Telephone Encounter (Signed)
Called  [facility] pharmacy in [place] to cancel pt prescription for cefadroxil. Spoke to the pharmacist and she verbalized her understanding. ?Philippa Chester, CMA ? ?

## 2021-06-18 NOTE — Telephone Encounter (Signed)
Thanks Cece

## 2021-06-19 ENCOUNTER — Other Ambulatory Visit: Payer: Self-pay | Admitting: Internal Medicine

## 2021-06-19 ENCOUNTER — Ambulatory Visit (HOSPITAL_COMMUNITY)
Admission: RE | Admit: 2021-06-19 | Discharge: 2021-06-19 | Disposition: A | Discharge status: Home / Self Care | Payer: Medicaid Other | Source: Ambulatory Visit | Attending: Internal Medicine | Admitting: Internal Medicine

## 2021-06-19 ENCOUNTER — Other Ambulatory Visit: Payer: Self-pay

## 2021-06-19 DIAGNOSIS — M86172 Other acute osteomyelitis, left ankle and foot: Secondary | ICD-10-CM

## 2021-06-19 MED ORDER — IOHEXOL 300 MG/ML  SOLN
100.0000 mL | Freq: Once | INTRAMUSCULAR | Status: AC | PRN
  Administered 2021-06-19: 10 mL via INTRAVENOUS

## 2021-06-19 MED ORDER — HEPARIN SOD (PORK) LOCK FLUSH 100 UNIT/ML IV SOLN
INTRAVENOUS | Status: AC
  Filled 2021-06-19: qty 5

## 2021-06-19 MED ORDER — LIDOCAINE HCL 1 % IJ SOLN
INTRAMUSCULAR | Status: AC
  Filled 2021-06-19: qty 20

## 2021-06-19 NOTE — Telephone Encounter (Signed)
Patient called to inform Dr.Vu that his PICC line was placed in a different arm and he is getting IV abx this afternoon.  ? ? ? ?Eilene Voigt P Maesyn Frisinger, CMA ? ?

## 2021-06-19 NOTE — Procedures (Signed)
PROCEDURE SUMMARY: ? ?Successful placement of single lumen PICC line to left brachial vein. ?Length 45 cm ?Tip at lower SVC/RA ?PICC capped ?No complications ?Ready for use ? ?EBL < 5 mL ? ? ?Anina Schnake H Athanasia Stanwood PA-C ?06/19/2021, 2:25 PM ? ? ? ?

## 2021-06-25 ENCOUNTER — Telehealth: Payer: Self-pay | Admitting: Family

## 2021-06-25 ENCOUNTER — Encounter: Payer: Self-pay | Admitting: Family

## 2021-06-25 ENCOUNTER — Ambulatory Visit (INDEPENDENT_AMBULATORY_CARE_PROVIDER_SITE_OTHER): Payer: Medicaid Other | Admitting: Family

## 2021-06-25 DIAGNOSIS — Z89512 Acquired absence of left leg below knee: Secondary | ICD-10-CM

## 2021-06-25 NOTE — Telephone Encounter (Signed)
Called and sw pt's wife. Advised per Dr. Lajoyce Corners that the pt had 30 staples removed at appt today. Staple gun holds 30 staples and he used every one.  Pt has an appt to follow up next month.  ?

## 2021-06-25 NOTE — Progress Notes (Addendum)
? ?Post-Op Visit Note ?  ?Patient: [name]           ?Date of Birth: 01-Dec-1970           ?MRN: [id number] ?Visit Date: 06/25/2021 ?PCP: [place], Sarah T, MD ? ?Chief Complaint:  ?Chief Complaint  ?Patient presents with  ? Left Leg - Routine Post Op  ?  05/28/21 left BKA  ? ? ?HPI:  ?HPI ?Is a 51 year old gentleman seen 3 weeks status post below-knee amputation on the left this is well-healed ? ?Patient is a new left transtibial  amputee. ? ?Patient's current comorbidities are not expected to impact the ability to function with the prescribed prosthesis. ?Patient verbally communicates a strong desire to use a prosthesis. ?Patient currently requires mobility aids to ambulate without a prosthesis.  Expects not to use mobility aids with a new prosthesis. ? ?Patient is a K3 level ambulator that spends a lot of time walking around on uneven terrain over obstacles, up and down stairs, and ambulates with a variable cadence. ? ? ? ? ?Ortho Exam ?Incision well approximated staples well-healed.  There is no gaping no drainage no surrounding erythema ? ?Visit Diagnoses: No diagnosis found. ? ?Plan: Continue with shrinker he has an Warehouse manager he has his order for his prosthesis set up and will be calling for his first appointment he will follow-up in the office in 2 weeks ? ?Order provided for the patient to obtain a manual wheelchair.  Having a wheelchair will significantly improve the patient's ability to complete his ADLs and participate in his MR ADLs in the home.  Unfortunately he is unable to use a cane or a walker to move around his home sufficiently. ? ?Follow-Up Instructions: No follow-ups on file.  ? ?Imaging: ?No results found. ? ?Orders:  ?No orders of the defined types were placed in this encounter. ? ?No orders of the defined types were placed in this encounter. ? ? ? ?PMFS History: ?Patient Active Problem List  ? Diagnosis Date Noted  ? Sepsis due to methicillin susceptible Staphylococcus aureus (HCC)  05/27/2021  ? Acute osteomyelitis of left calcaneus (HCC)   ? Right shoulder pain 05/26/2021  ? Essential hypertension 05/26/2021  ? MSSA bacteremia 05/26/2021  ? Osteomyelitis (HCC) 10/20/2020  ? 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 Pernicious anemia 06/11/2020  ? Moderate protein-calorie malnutrition (HCC) 05/28/2020  ? Osteomyelitis of second toe of left foot (HCC) 05/28/2020  ? Cellulitis of left foot 10/19/2019  ? Hyponatremia 10/19/2019  ? 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 Ulcer of foot due to diabetes (HCC) 10/19/2019  ? Insulin-requiring or dependent type II diabetes mellitus (HCC) 06/21/2019  ? ?Past Medical History:  ?Diagnosis Date  ? Diabetes mellitus without complication (HCC)   ? Hypertension   ?  ?Family History  ?Problem Relation Age of Onset  ? Diabetes Mellitus II Sister   ?  ?Past Surgical History:  ?Procedure Laterality Date  ? AMPUTATION Left 05/28/2021  ? Procedure: LEFT BELOW KNEE AMPUTATION;  Surgeon: Nadara Mustard, MD;  Location: [facility] OR;  Service: Orthopedics;  Laterality: Left;  ? AMPUTATION TOE Left 10/21/2020  ? Procedure: LEFT SECOND TOE AMPUTATION;  Surgeon: Park Liter, DPM;  Location: [facility];  Service: Podiatry;  Laterality: Left;  ? mastoid surgery    ? ?Social History  ? ?Occupational History  ? Not on file  ?Tobacco Use  ? Smoking status: Former  ? Smokeless tobacco: Never  ?Substance and  Sexual Activity  ? Alcohol use: No  ? Drug use: No  ? Sexual activity: Not on file  ? ? ?

## 2021-06-25 NOTE — Telephone Encounter (Signed)
Pt had an appt today and explicitly know the amount of staples that was removed from him today. Pt wants to know how many were taken out before schl'ing 2 week appt (return around 07/09/21 for po follow up). The best call back number is [phone] and can leave vm on machine if needed.  ?

## 2021-06-27 ENCOUNTER — Telehealth: Payer: Self-pay

## 2021-06-27 NOTE — Telephone Encounter (Signed)
[facility] orders to [facility] that okay to pull PICC after last dose on 4/4 per Dr. Renold Don. [facility] pharmacy notified.  ? ?Called patient's wife to let her know. They need to reschedule their appointment with Dr. Renold Don and are not available for any of his open slots. They accept 30 minute appointment with Dr. Daiva Eves on 4/4. ? ?Sandie Ano, RN ? ?

## 2021-06-27 NOTE — Telephone Encounter (Signed)
Patient's wife called wanting to know timeline for PICC removal. Will route to provider.  ? ? ?Beryle Flock, RN ? ?

## 2021-06-28 ENCOUNTER — Other Ambulatory Visit: Payer: Self-pay | Admitting: Orthopedic Surgery

## 2021-06-28 ENCOUNTER — Telehealth: Payer: Self-pay | Admitting: Family

## 2021-06-28 NOTE — Telephone Encounter (Signed)
Patient called needing  a stool softener sent to his pharmacy. Patient said he would like to get something prescribed for him. ? Patient uses [address] in [place]. The number to contact  patient is [phone] ?

## 2021-06-28 NOTE — Telephone Encounter (Signed)
I called patient and advised. He states that you prescribed him EQ vegetable stool softeners and those worked good. His pharmacy told him you would need to send in to the pharmacy. I explained that I did not see where this has been prescribed, and he states he talked with his PCP and she told him he should speak with you as you did his surgery. Please advise. ?

## 2021-07-01 ENCOUNTER — Telehealth: Payer: Self-pay

## 2021-07-01 NOTE — Telephone Encounter (Signed)
Patient called stating that for the past 2 or 3 days he has had a funny smell when he urinates and wanted to know if Dr. Renold Don could have the home health nurse take a urine sample. I advise pt that his pcp could see him for this. Pt stated that he already saw his pcp and they did not take a urine sample. I advised the pt that he can go to an urgent care and be seen as well. Pt stated that he will go to the urgent care. ?Philippa Chester, CMA ? ?

## 2021-07-01 NOTE — Telephone Encounter (Signed)
No need to check utine. Just let him know to drink good amount of fluid each day to keep urine color clear (sign of good hydration)

## 2021-07-01 NOTE — Telephone Encounter (Signed)
Called pt to relay message below per Dr. Renold Don. Pt verbalized understanding and stated he has an appointment with his pcp tomorrow morning. ?

## 2021-07-03 NOTE — Telephone Encounter (Signed)
Pt informed. He has been using generic senekot ?

## 2021-07-03 NOTE — Telephone Encounter (Signed)
LMTCB

## 2021-07-09 ENCOUNTER — Ambulatory Visit: Payer: Medicaid Other | Admitting: Infectious Disease

## 2021-07-10 ENCOUNTER — Telehealth: Payer: Self-pay | Admitting: Orthopedic Surgery

## 2021-07-10 NOTE — Telephone Encounter (Signed)
Patient called needing a Rx for a wheelchair. Patient asked if the Rx can be sent to [facility] in [place].or the other location is [facility] [address] in [place]    The number to contact patient is   [phone] ?

## 2021-07-11 ENCOUNTER — Ambulatory Visit: Payer: Medicaid Other | Admitting: Internal Medicine

## 2021-07-15 ENCOUNTER — Encounter: Payer: Self-pay | Admitting: Orthopedic Surgery

## 2021-07-15 NOTE — Telephone Encounter (Signed)
Order faxed to [facility] in [place] at [phone] ?

## 2021-07-16 ENCOUNTER — Encounter: Payer: Medicaid Other | Admitting: Family

## 2021-07-19 ENCOUNTER — Telehealth: Payer: Self-pay | Admitting: Orthopedic Surgery

## 2021-07-19 NOTE — Telephone Encounter (Signed)
Pt is calling he states that [facility] in [place] at [phone] needs additional information ?

## 2021-07-22 NOTE — Telephone Encounter (Signed)
Looks like you sent an rx for a wheelchair for this pt. Can you see what all they are needing?  ?

## 2021-07-22 NOTE — Telephone Encounter (Signed)
[facility] sent fax to [place] with certain verbiage to add to last OV note. Denny Peon saw this pt last time on 06/25/21 will give her info to add for approval. ?

## 2021-07-23 ENCOUNTER — Ambulatory Visit: Payer: Medicaid Other | Admitting: Infectious Disease

## 2021-07-23 NOTE — Telephone Encounter (Signed)
Denny Peon has completed note with certain verbiage. She has faxed back to company ?

## 2021-08-02 ENCOUNTER — Telehealth: Payer: Self-pay | Admitting: Orthopedic Surgery

## 2021-08-02 ENCOUNTER — Other Ambulatory Visit: Payer: Self-pay

## 2021-08-02 ENCOUNTER — Telehealth: Payer: Self-pay | Admitting: Family

## 2021-08-02 NOTE — Telephone Encounter (Signed)
Patient called. He says he need outpatient PT referral sent to [facility] in [place]. Also he would like a rx for a walker with wheels. His call back number is [phone] ?

## 2021-08-02 NOTE — Telephone Encounter (Signed)
I called and lm on vm for pt to advise that I can order home delivery medical supplies but I am needing to know what type of dressing he is wanting, I can place an order through [employer] for the rolling walker if he would like me to do this for him and I cam order PT just need to know if he has his prosthetic already.  ?

## 2021-08-02 NOTE — Telephone Encounter (Signed)
Pt called stating he need medical supplies. Pt states he need referral for pt and walker with wheel in the back. Please call pt to clarify all needed items and what location to send too. I did not fully understand what pt need. Pt phone number is [phone]. ?

## 2021-08-02 NOTE — Telephone Encounter (Signed)
Duplicate message. 

## 2021-08-02 NOTE — Telephone Encounter (Signed)
Pt called he does not need medical supplies. Order was faxed for walker with demo sheet and last office visit note to [facility] per request. Fax number [phone] Will fax order for therapy  ?

## 2021-08-05 ENCOUNTER — Telehealth: Payer: Self-pay | Admitting: Orthopedic Surgery

## 2021-08-05 NOTE — Telephone Encounter (Signed)
Pt wife called and states this is the fax number they need the physical therapy referral sent to [phone].  ? ? ?

## 2021-08-05 NOTE — Telephone Encounter (Signed)
This has been done.

## 2021-08-05 NOTE — Telephone Encounter (Signed)
Order was faxed today to [facility] with demo sheet and advised to contact pt direct to set up for therapy and call with any questions.  ?

## 2021-08-06 ENCOUNTER — Telehealth: Payer: Self-pay

## 2021-08-06 ENCOUNTER — Telehealth: Payer: Self-pay | Admitting: Family

## 2021-08-06 NOTE — Telephone Encounter (Signed)
06/25/21 ov note faxed to [facility] [phone] ?

## 2021-08-06 NOTE — Telephone Encounter (Signed)
Huntley Dec advised that she can use the order that was sent.  ?

## 2021-08-06 NOTE — Telephone Encounter (Signed)
Rec'd email from Chapman Fitch with [facility] and she states that [facility] does not accept the pt's insurance and they forwarded the referral for therapy to their office. Will complete their referral and fax back to that office.  ?

## 2021-08-13 ENCOUNTER — Encounter: Payer: Medicaid Other | Admitting: Family

## 2021-08-29 ENCOUNTER — Ambulatory Visit: Payer: Medicaid Other | Admitting: Infectious Disease

## 2021-09-06 ENCOUNTER — Telehealth: Payer: Self-pay | Admitting: Family

## 2021-09-06 ENCOUNTER — Other Ambulatory Visit: Payer: Self-pay | Admitting: Orthopedic Surgery

## 2021-09-06 MED ORDER — METHOCARBAMOL 500 MG PO TABS: 500.0000 mg | ORAL_TABLET | Freq: Three times a day (TID) | ORAL | 0 refills | Status: DC

## 2021-09-06 NOTE — Telephone Encounter (Signed)
Tried calling pt back to inform of Rx being sent in, Number provided is not in service.

## 2021-09-06 NOTE — Telephone Encounter (Signed)
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

## 2021-09-06 NOTE — Telephone Encounter (Signed)
Pt called stating that his physical therapist recommends to ask Dr. Lajoyce Corners for muscle relaxers to help with bend of pt's knee. Pt states unable to bend knee all the way down due to muscle tightening up. Please send muscle relaxer to pharmacy on file. Pt phone number is [phone].

## 2021-10-07 ENCOUNTER — Other Ambulatory Visit: Payer: Self-pay | Admitting: Orthopedic Surgery

## 2021-11-04 ENCOUNTER — Telehealth: Payer: Self-pay

## 2021-11-04 NOTE — Telephone Encounter (Signed)
Signed by Dr. Lajoyce Corners this morning. Faxing over to [facility]. Javier Docker

## 2021-11-04 NOTE — Telephone Encounter (Signed)
[name] with [facility] called asking for update on form that was faxed on 10/31/21. Her contact is [phone] and direct fax is [phone]

## 2021-12-03 ENCOUNTER — Telehealth: Payer: Self-pay | Admitting: Orthopedic Surgery

## 2021-12-03 NOTE — Telephone Encounter (Signed)
Alisha from [facility] called requesting the last 90 day office notes to be faxed to [phone].

## 2021-12-04 NOTE — Telephone Encounter (Signed)
Last 2 notes faxed [facility] [phone]

## 2021-12-04 NOTE — Telephone Encounter (Signed)
Is this something we can do?

## 2021-12-11 ENCOUNTER — Ambulatory Visit: Payer: Medicaid Other | Admitting: Family

## 2021-12-19 ENCOUNTER — Telehealth: Payer: Self-pay | Admitting: Orthopedic Surgery

## 2021-12-19 ENCOUNTER — Ambulatory Visit (INDEPENDENT_AMBULATORY_CARE_PROVIDER_SITE_OTHER): Payer: Medicaid Other | Admitting: Orthopedic Surgery

## 2021-12-19 ENCOUNTER — Encounter: Payer: Self-pay | Admitting: Orthopedic Surgery

## 2021-12-19 DIAGNOSIS — M24561 Contracture, right knee: Secondary | ICD-10-CM

## 2021-12-19 DIAGNOSIS — Z89512 Acquired absence of left leg below knee: Secondary | ICD-10-CM

## 2021-12-19 NOTE — Telephone Encounter (Signed)
Faxed office visit note from today's visit to [phone] per pt request

## 2021-12-19 NOTE — Progress Notes (Signed)
Office Visit Note   Patient: [name]           Date of Birth: Nov 20, 1970           MRN: [id number] Visit Date: 12/19/2021              Requested by: [place], Sarah T, MD [address] [place],  [place] [address] PCP: [place], Sarah T, MD  Chief Complaint  Patient presents with   Right Leg - Pain      HPI: Patient is a 51 year old gentleman who presents for 2 separate issues.  #1 he is status post revision left transtibial amputation he is currently ambulating with his prosthesis without any complaints other than a rash from the liner.  #2 patient has a flexion contracture of the right knee he has been working with therapy but still has not regained full extension.  Assessment & Plan: Visit Diagnoses:  1. Flexion contracture of knee, right   2. Left below-knee amputee [facility])     Plan: A prescription was provided for a [employer] brace for the right knee to work on the flexion contracture.  He was provided a handwritten prescription.  Follow-Up Instructions: Return if symptoms worsen or fail to improve.   Ortho Exam  Patient is alert, oriented, no adenopathy, well-dressed, normal affect, normal respiratory effort. Examination patient's left transtibial amputation is well consolidated there is no redness no swelling no ulcers.  Examination the right knee patient lacks 10 degrees to full extension.  No signs of infection.  Patient's most recent hemoglobin A1c is 9.0.  Imaging: No results found. No images are attached to the encounter.  Labs: Lab Results  Component Value Date   HGBA1C 9.0 (H) 05/26/2021   HGBA1C 9.2 (A) 06/21/2019   HGBA1C 10.3 (H) 11/06/2013   ESRSEDRATE 3 11/05/2013   REPTSTATUS 05/31/2021 FINAL 05/26/2021   CULT  05/26/2021    NO GROWTH 5 DAYS Performed at [facility], [address] [place], [place] [address]      Lab Results  Component Value Date   ALBUMIN 2.4 (L) 05/26/2021   ALBUMIN 4.5 11/05/2013   ALBUMIN 3.5 11/15/2012     Lab Results  Component Value Date   MG 2.1 11/05/2013   No results found for: "VD25OH"  No results found for: "PREALBUMIN"    Latest Ref Rng & Units 05/30/2021    3:53 AM 05/29/2021    4:33 AM 05/28/2021    3:16 AM  CBC EXTENDED  WBC 4.0 - 10.5 K/uL 9.4  9.3  8.5   RBC 4.22 - 5.81 MIL/uL 3.73  3.76  4.03   Hemoglobin 13.0 - 17.0 g/dL 44.0  10.2  72.5   HCT 39.0 - 52.0 % 33.3  33.3  35.8   Platelets 150 - 400 K/uL 478  490  454      There is no height or weight on file to calculate BMI.  Orders:  No orders of the defined types were placed in this encounter.  No orders of the defined types were placed in this encounter.    Procedures: No procedures performed  Clinical Data: No additional findings.  ROS:  All other systems negative, except as noted in the HPI. Review of Systems  Objective: Vital Signs: There were no vitals taken for this visit.  Specialty Comments:  No specialty comments available.  PMFS History: Patient Active Problem List   Diagnosis Date Noted   Sepsis due to methicillin susceptible Staphylococcus aureus (HCC)  05/27/2021   Acute osteomyelitis of left calcaneus (HCC)    Right shoulder pain 05/26/2021   Essential hypertension 05/26/2021   MSSA bacteremia 05/26/2021   Osteomyelitis (HCC) 10/20/2020   Acute on chronic osteomyelitis of left foot with abscess  06/11/2020   Pernicious anemia 06/11/2020   Moderate protein-calorie malnutrition (HCC) 05/28/2020   Osteomyelitis of second toe of left foot (HCC) 05/28/2020   Cellulitis of left foot 10/19/2019   Hyponatremia 10/19/2019   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   Ulcer of foot due to diabetes (HCC) 10/19/2019   Insulin-requiring or dependent type II diabetes mellitus (HCC) 06/21/2019   Past Medical History:  Diagnosis Date   Diabetes mellitus without complication (HCC)    Hypertension     Family History  Problem Relation Age of Onset   Diabetes Mellitus II Sister     Past  Surgical History:  Procedure Laterality Date   AMPUTATION Left 05/28/2021   Procedure: LEFT BELOW KNEE AMPUTATION;  Surgeon: Nadara Mustard, MD;  Location: [facility] OR;  Service: Orthopedics;  Laterality: Left;   AMPUTATION TOE Left 10/21/2020   Procedure: LEFT SECOND TOE AMPUTATION;  Surgeon: Park Liter, DPM;  Location: [facility] ORS;  Service: Podiatry;  Laterality: Left;   mastoid surgery     Social History   Occupational History   Not on file  Tobacco Use   Smoking status: Former   Smokeless tobacco: Never  Substance and Sexual Activity   Alcohol use: No   Drug use: No   Sexual activity: Not on file

## 2021-12-19 NOTE — Telephone Encounter (Signed)
Patient called. Says Brandy from [facility] [employer] needs his office notes for his splint. Fax  number is [phone]

## 2022-01-03 ENCOUNTER — Telehealth: Payer: Self-pay | Admitting: Orthopedic Surgery

## 2022-01-03 NOTE — Telephone Encounter (Signed)
Can you please write for pt

## 2022-01-03 NOTE — Telephone Encounter (Signed)
Pt called requesting a new script for prosthetic left leg. Pt states he went to [facility] and they explained he needs new leg. Pt is wearing too many sock. Please call pt about this matter at [phone].

## 2022-01-07 NOTE — Telephone Encounter (Signed)
Tried calling patient to inform of script being sent to [facility], No answer. [facility] will f/u with patient once Rx received.

## 2022-01-07 NOTE — Telephone Encounter (Signed)
done

## 2022-07-13 IMAGING — DX DG CHEST 1V PORT
1 series · 1 of 1 positions shown · non-contrast
Comparison: 01/05/2020

CLINICAL DATA: PICC line placement

EXAM:
PORTABLE CHEST 1 VIEW

[chest]
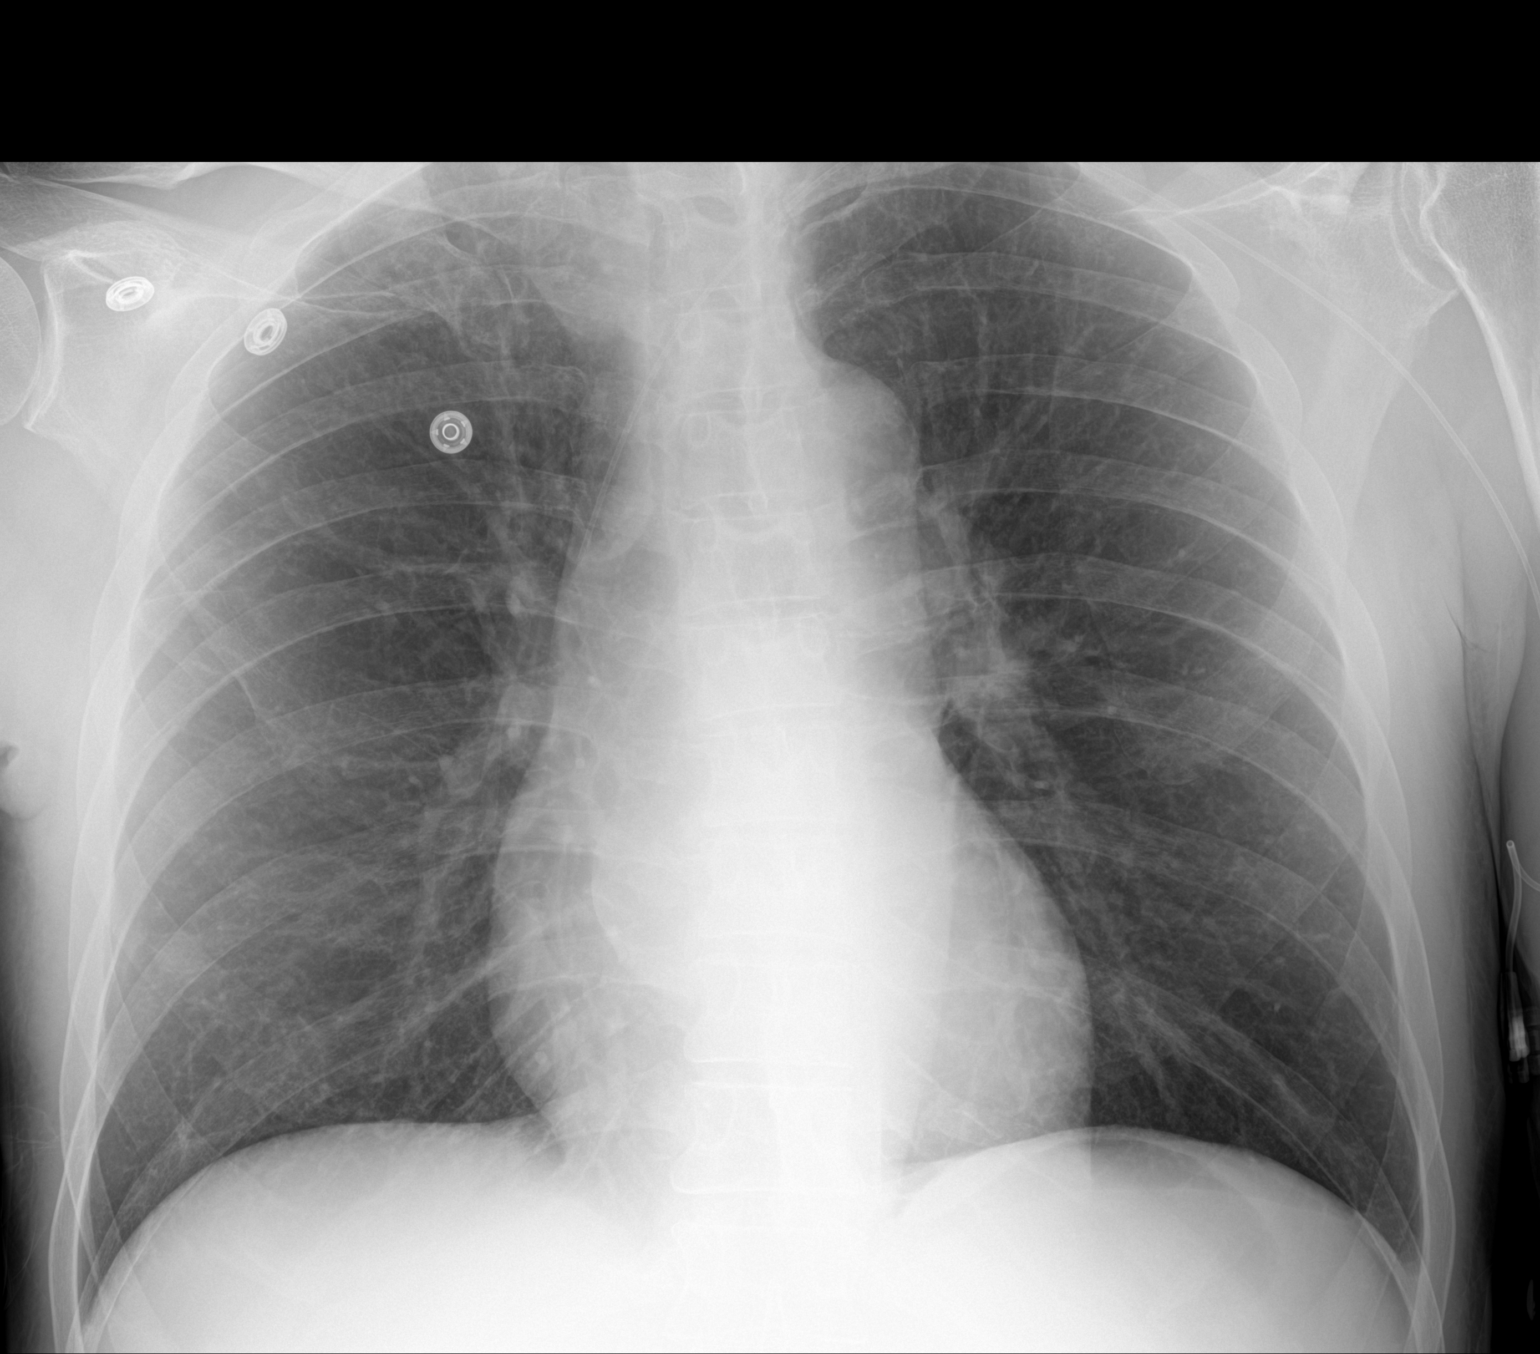

[1 of 1 positions shown; findings below may reference images not displayed]

FINDINGS: Cardiac size is within normal limits. There are no signs of
pulmonary edema or focal pulmonary consolidation. There is no
pleural effusion or pneumothorax. PICC line has been introduced
through the left upper extremity with its tip in the superior vena
cava.
IMPRESSION: No active disease.

## 2022-08-03 IMAGING — XA IR PICC >5YO
1 series · 2 of 2 positions shown · IV contrast (omnipaque)
Comparison: none

INDICATION: Left calcaneal diabetic ulcer, s/p left PICC line placement by IV
team. Malfunctioning PICC line, request for PICC line exchange with
possible new PICC line placement.

EXAM:
ULTRASOUND AND FLUOROSCOPIC GUIDED PICC LINE INSERTION
MEDICATIONS:
1% lidocaine
CONTRAST:  3 mL Omnipaque 300
FLUOROSCOPY TIME:  24 seconds (1.1 mGy)
COMPLICATIONS:
None immediate.
TECHNIQUE: The procedure, risks, benefits, and alternatives were explained to
the patient and informed written consent was obtained.

[Series 2: fl (-) angio · 2 of 2 slices shown]
[im 1/2]
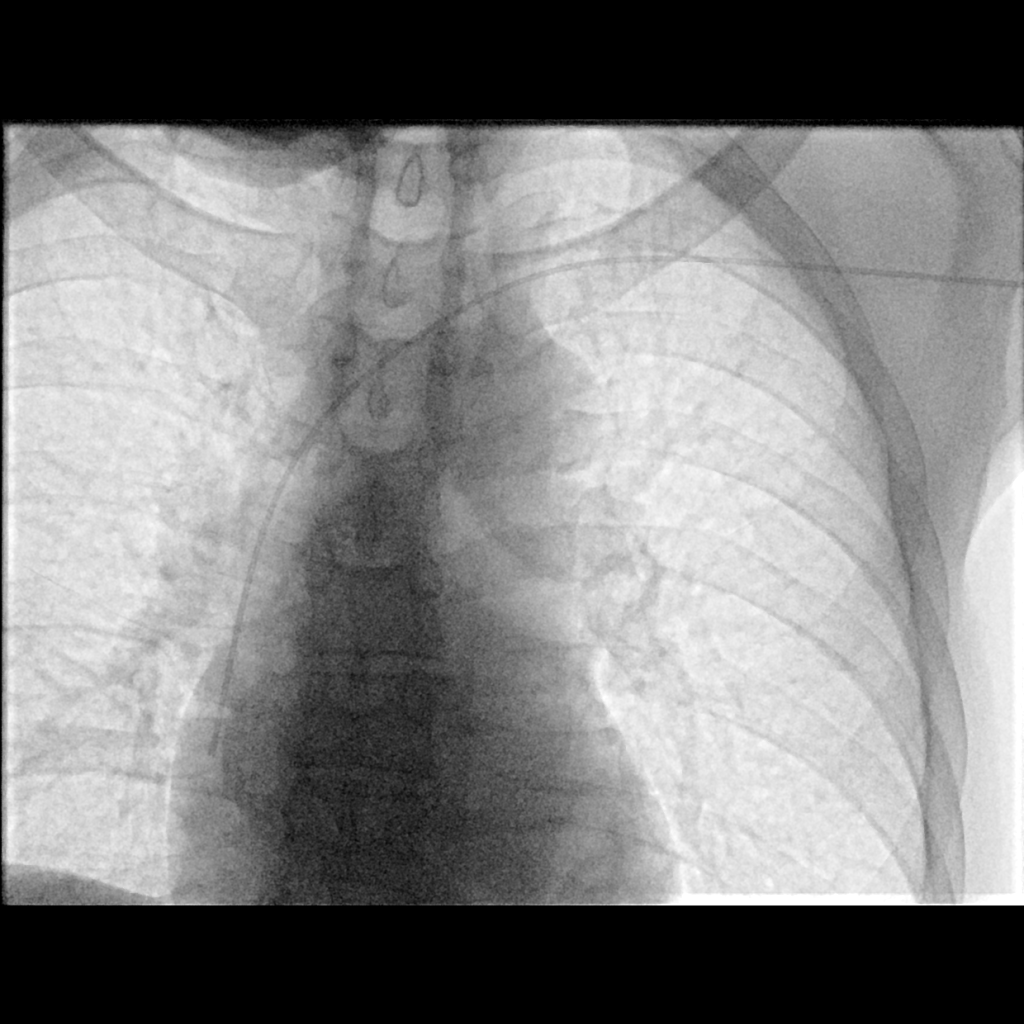
[im 2/2]
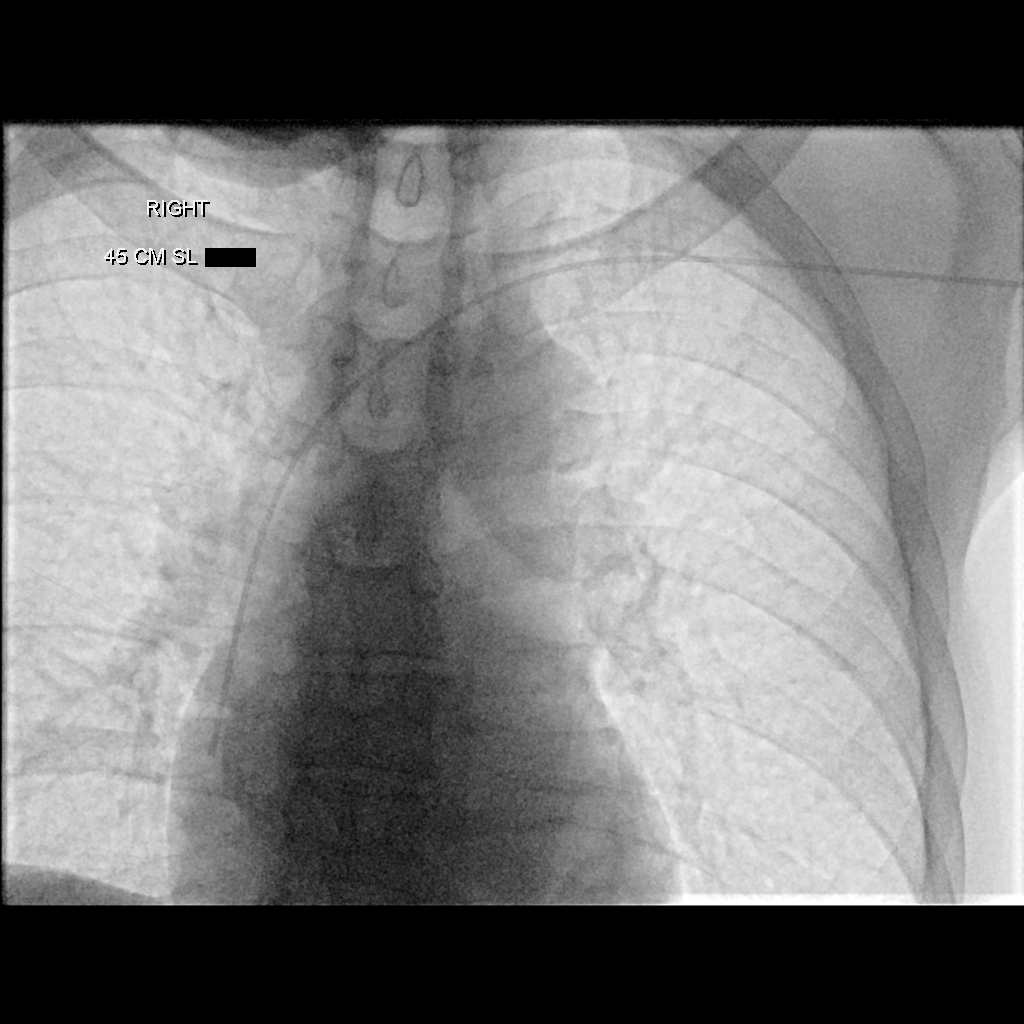

[2 of 2 positions shown; findings below may reference images not displayed]

The LEFT upper extremity was prepped with chlorhexidine in a sterile
fashion, and a sterile drape was applied covering the operative
field. Maximum barrier sterile technique with sterile gowns and
gloves were used for the procedure. A timeout was performed prior to
the initiation of the procedure.

A guidewire was advanced through the existing PICC line and the
existing PICC line was removed over the wire. A peel-away sheath was
placed, the guidewire was removed. Attempt to aspirate but no blood
return seen.

3 mL of Omnipaque 300 was administered via the peel-away sheath,
torturous and mildly stenosed vein was found. Decision was made to
place a new PICC line. The peel-away sheath was removed, hemostasis
was obtained by applying manual compression for 10 minutes.

LEFT brachial vein was localized with ultrasound, local anesthesia
was provided with 1% lidocaine.

After the overlying soft tissues were anesthetized with 1%
lidocaine, a micropuncture kit was utilized to access the LEFT
brachial vein. Real-time ultrasound guidance was utilized for
vascular access including the acquisition of a permanent ultrasound
image documenting patency of the accessed vessel.

A guidewire was advanced to the level of the superior caval-atrial
junction for measurement purposes and the PICC line was cut to
length. A peel-away sheath was placed and a 45 cm, 5 French, single
lumen was inserted to level of the superior caval-atrial junction. A
post procedure spot fluoroscopic was obtained. The catheter easily
aspirated and flushed and was secured in place. A dressing was
placed. The patient tolerated the procedure well without immediate
post procedural complication.
FINDINGS: After catheter placement, the tip lies within the superior
cavoatrial junction. The catheter aspirates and flushes normally and
is ready for immediate use.
IMPRESSION: Successful placement of a LEFT brachial vein approach, 45 cm, 5
French, single lumen PICC, as above.

The tip of the catheter is positioned within the superior cavoatrial
junction. The PICC line is ready for immediate use.

## 2022-09-10 ENCOUNTER — Encounter: Payer: Self-pay | Admitting: Family

## 2022-09-10 ENCOUNTER — Ambulatory Visit (INDEPENDENT_AMBULATORY_CARE_PROVIDER_SITE_OTHER): Payer: Medicaid Other | Admitting: Family

## 2022-09-10 DIAGNOSIS — Z89512 Acquired absence of left leg below knee: Secondary | ICD-10-CM | POA: Diagnosis not present

## 2022-09-10 NOTE — Progress Notes (Signed)
Office Visit Note   Patient: [name]           Date of Birth: [birth year]           MRN: [id number] Visit Date: 09/10/2022              Requested by: [place], Sarah T, MD [address] [place],  [place] [address] PCP: [place], Sarah T, MD  Chief Complaint  Patient presents with   Left Leg - Follow-up    Left BKA prosthetic rx      HPI: The patient is a 52 year old gentleman seen today status post remote left below-knee amputation his current prosthesis socket is ill fitting he has been wearing over 3 layers of ply and is developing pressure areas beginning to have skin breakdown.  From L fitting socket.  Assessment & Plan: Visit Diagnoses: No diagnosis found.  Plan: Given an order for new socket set up he will follow-up in the office as needed  Follow-Up Instructions: No follow-ups on file.   Ortho Exam  Patient is alert, oriented, no adenopathy, well-dressed, normal affect, normal respiratory effort. On examination of the left residual limb this is well consolidated well-healed he does have a healing blister over the tibial tubercle from pressure he is also developing some ulcerations to the distal residual limb from symptoms from subsiding into his socket there is no drainage or erythema no cellulitis  Imaging: No results found. No images are attached to the encounter.  Labs: Lab Results  Component Value Date   HGBA1C 9.0 (H) 05/26/2021   HGBA1C 9.2 (A) 06/21/2019   HGBA1C 10.3 (H) 11/06/2013   ESRSEDRATE 3 11/05/2013   REPTSTATUS 05/31/2021 FINAL 05/26/2021   CULT  05/26/2021    NO GROWTH 5 DAYS Performed at [facility], [address] [place], [place] [address]      Lab Results  Component Value Date   ALBUMIN 2.4 (L) 05/26/2021   ALBUMIN 4.5 11/05/2013   ALBUMIN 3.5 11/15/2012    Lab Results  Component Value Date   MG 2.1 11/05/2013   No results found for: "VD25OH"  No results found for: "PREALBUMIN"    Latest Ref Rng & Units  05/30/2021    3:53 AM 05/29/2021    4:33 AM 05/28/2021    3:16 AM  CBC EXTENDED  WBC 4.0 - 10.5 K/uL 9.4  9.3  8.5   RBC 4.22 - 5.81 MIL/uL 3.73  3.76  4.03   Hemoglobin 13.0 - 17.0 g/dL 82.9  56.2  13.0   HCT 39.0 - 52.0 % 33.3  33.3  35.8   Platelets 150 - 400 K/uL 478  490  454      There is no height or weight on file to calculate BMI.  Orders:  No orders of the defined types were placed in this encounter.  No orders of the defined types were placed in this encounter.    Procedures: No procedures performed  Clinical Data: No additional findings.  ROS:  All other systems negative, except as noted in the HPI. Review of Systems  Objective: Vital Signs: There were no vitals taken for this visit.  Specialty Comments:  No specialty comments available.  PMFS History: Patient Active Problem List   Diagnosis Date Noted   Sepsis due to methicillin susceptible Staphylococcus aureus (HCC) 05/27/2021   Acute osteomyelitis of left calcaneus (HCC)    Right shoulder pain 05/26/2021   Essential hypertension 05/26/2021   MSSA bacteremia 05/26/2021  Osteomyelitis (HCC) 10/20/2020   Acute on chronic osteomyelitis of left foot with abscess  06/11/2020   Pernicious anemia 06/11/2020   Moderate protein-calorie malnutrition (HCC) 05/28/2020   Osteomyelitis of second toe of left foot (HCC) 05/28/2020   Cellulitis of left foot 10/19/2019   Hyponatremia 10/19/2019   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   Ulcer of foot due to diabetes (HCC) 10/19/2019   Insulin-requiring or dependent type II diabetes mellitus (HCC) 06/21/2019   Past Medical History:  Diagnosis Date   Diabetes mellitus without complication (HCC)    Hypertension     Family History  Problem Relation Age of Onset   Diabetes Mellitus II Sister     Past Surgical History:  Procedure Laterality Date   AMPUTATION Left 05/28/2021   Procedure: LEFT BELOW KNEE AMPUTATION;  Surgeon: Nadara Mustard, MD;  Location: [facility] OR;   Service: Orthopedics;  Laterality: Left;   AMPUTATION TOE Left 10/21/2020   Procedure: LEFT SECOND TOE AMPUTATION;  Surgeon: Park Liter, DPM;  Location: [facility] OR[facility];  Service: Podiatry;  Laterality: Left;   mastoid surgery     Social History   Occupational History   Not on file  Tobacco Use   Smoking status: Former   Smokeless tobacco: Never  Substance and Sexual Activity   Alcohol use: No   Drug use: No   Sexual activity: Not on file

## 2023-08-07 ENCOUNTER — Ambulatory Visit (INDEPENDENT_AMBULATORY_CARE_PROVIDER_SITE_OTHER): Admitting: Family

## 2023-08-07 ENCOUNTER — Encounter: Payer: Self-pay | Admitting: Family

## 2023-08-07 DIAGNOSIS — Z89512 Acquired absence of left leg below knee: Secondary | ICD-10-CM

## 2023-08-07 NOTE — Progress Notes (Signed)
 Office Visit Note   Patient: [name]           Date of Birth: [birth year]           MRN: [id number] Visit Date: 08/07/2023              Requested by: [place], Sarah T, MD [address] [place],  [place] [address] PCP: [place], Sarah T, MD  Chief Complaint  Patient presents with   Left Leg - Wound Check      HPI: The patient is a 53 year old gentleman who presents today for evaluation of ulcer to his left residual limb.  He has previously been seen by [facility] they discussed with him that his current socket is possibly ilL fitting. He has been using antibacterial ointment and Band-Aids   Assessment & Plan: Visit Diagnoses: No diagnosis found.  Plan: Given an order for socket replacement to [facility].  Also provided the patient with a liner liner today.  Discussed wear underneath the liner  Follow-Up Instructions: No follow-ups on file.   Ortho Exam  Patient is alert, oriented, no adenopathy, well-dressed, normal affect, normal respiratory effort. On examination left residual limb there is a distal ulcer from subsiding into his socket this is 2 cm x 1 cm with 2 mm of depth there is surrounding maceration and dermatitis there is no cellulitis no drainage no sign of infection  Imaging: No results found. No images are attached to the encounter.  Labs: Lab Results  Component Value Date   HGBA1C 9.0 (H) 05/26/2021   HGBA1C 9.2 (A) 06/21/2019   HGBA1C 10.3 (H) 11/06/2013   ESRSEDRATE 3 11/05/2013   REPTSTATUS 05/31/2021 FINAL 05/26/2021   CULT  05/26/2021    NO GROWTH 5 DAYS Performed at [facility], [address], [place], [place] [address]      Lab Results  Component Value Date   ALBUMIN 2.4 (L) 05/26/2021   ALBUMIN 4.5 11/05/2013   ALBUMIN 3.5 11/15/2012    Lab Results  Component Value Date   MG 2.1 11/05/2013   No results found for: "VD25OH"  No results found for: "PREALBUMIN"    Latest Ref Rng & Units 05/30/2021    3:53 AM 05/29/2021     4:33 AM 05/28/2021    3:16 AM  CBC EXTENDED  WBC 4.0 - 10.5 K/uL 9.4  9.3  8.5   RBC 4.22 - 5.81 MIL/uL 3.73  3.76  4.03   Hemoglobin 13.0 - 17.0 g/dL 95.2  84.1  32.4   HCT 39.0 - 52.0 % 33.3  33.3  35.8   Platelets 150 - 400 K/uL 478  490  454      There is no height or weight on file to calculate BMI.  Orders:  No orders of the defined types were placed in this encounter.  No orders of the defined types were placed in this encounter.    Procedures: No procedures performed  Clinical Data: No additional findings.  ROS:  All other systems negative, except as noted in the HPI. Review of Systems  Objective: Vital Signs: There were no vitals taken for this visit.  Specialty Comments:  No specialty comments available.  PMFS History: Patient Active Problem List   Diagnosis Date Noted   Sepsis due to methicillin susceptible Staphylococcus aureus (HCC) 05/27/2021   Acute osteomyelitis of left calcaneus (HCC)    Right shoulder pain 05/26/2021   Essential hypertension 05/26/2021   MSSA bacteremia 05/26/2021   Osteomyelitis (HCC) 10/20/2020  Acute on chronic osteomyelitis of left foot with abscess  06/11/2020   Pernicious anemia 06/11/2020   Moderate protein-calorie malnutrition (HCC) 05/28/2020   Osteomyelitis of second toe of left foot (HCC) 05/28/2020   Cellulitis of left foot 10/19/2019   Hyponatremia 10/19/2019   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   Ulcer of foot due to diabetes (HCC) 10/19/2019   Insulin -requiring or dependent type II diabetes mellitus (HCC) 06/21/2019   Past Medical History:  Diagnosis Date   Diabetes mellitus without complication (HCC)    Hypertension     Family History  Problem Relation Age of Onset   Diabetes Mellitus II Sister     Past Surgical History:  Procedure Laterality Date   AMPUTATION Left 05/28/2021   Procedure: LEFT BELOW KNEE AMPUTATION;  Surgeon: Timothy Ford, MD;  Location: [facility] OR;  Service: Orthopedics;  Laterality: Left;    AMPUTATION TOE Left 10/21/2020   Procedure: LEFT SECOND TOE AMPUTATION;  Surgeon: Camilo Cella, DPM;  Location: [facility] OR[facility];  Service: Podiatry;  Laterality: Left;   mastoid surgery     Social History   Occupational History   Not on file  Tobacco Use   Smoking status: Former   Smokeless tobacco: Never  Substance and Sexual Activity   Alcohol use: No   Drug use: No   Sexual activity: Not on file

## 2023-08-26 ENCOUNTER — Telehealth: Payer: Self-pay | Admitting: Orthopedic Surgery

## 2023-08-26 ENCOUNTER — Other Ambulatory Visit: Payer: Self-pay

## 2023-08-26 NOTE — Telephone Encounter (Signed)
 Letter up front fro pick up

## 2023-08-26 NOTE — Telephone Encounter (Signed)
 Patient called. Says he needs a letter stating that he has a false leg. He is going to visit someone in prison this weekend. He will be by Friday to pick it up.

## 2023-08-28 ENCOUNTER — Telehealth: Payer: Self-pay | Admitting: Orthopedic Surgery

## 2023-08-28 NOTE — Telephone Encounter (Signed)
 The patient called on 08/26/2023 and today. There was not a number taken with the message. The number in the chart is incorrect. Called x 3 and it is the wrong number.  A message was left on vm for his spouse to call back. The letter is ready it is at the front desk for pick up but I do not have anyway of contacting the patient.

## 2023-08-28 NOTE — Telephone Encounter (Signed)
 Called patient's wife Evalyn Hillier [phone]) to let them know the letter is ready for pick up at the front desk. She did not answer so left VM.   FYI Patient's number on file is not correct. If Wife calls back  please ask for patients CORRECT Phone number. Thank you.

## 2023-08-28 NOTE — Telephone Encounter (Signed)
 Pt needs a note stating he had a amputation and he has to have his leg. Pt is visiting someone who is in a prison so he just needs a written letter stating he has to have his leg. [place] can sign the letter as well.

## 2024-01-26 ENCOUNTER — Telehealth: Payer: Self-pay | Admitting: Family

## 2024-01-26 NOTE — Telephone Encounter (Signed)
 Rx written, in outbox for medical records to fax.

## 2024-01-26 NOTE — Telephone Encounter (Signed)
 Patient called. He needs a RX sent to [facility] for his socket.
